# Patient Record
Sex: Female | Born: 1935 | Race: Black or African American | Hispanic: No | Smoking: Never smoker
Health system: Southern US, Community
[De-identification: ages and names within clinical notes are randomized; demographics above are authoritative.]

## PROBLEM LIST (undated history)

## (undated) DIAGNOSIS — E785 Hyperlipidemia, unspecified: Secondary | ICD-10-CM

## (undated) DIAGNOSIS — R0600 Dyspnea, unspecified: Secondary | ICD-10-CM

## (undated) DIAGNOSIS — I10 Essential (primary) hypertension: Secondary | ICD-10-CM

## (undated) HISTORY — DX: Morbid (severe) obesity due to excess calories: E66.01

## (undated) HISTORY — DX: Dyspnea, unspecified: R06.00

## (undated) HISTORY — DX: Hyperlipidemia, unspecified: E78.5

## (undated) HISTORY — DX: Essential (primary) hypertension: I10

---

## 1970-04-05 HISTORY — PX: PARTIAL HYSTERECTOMY: SHX80

## 2006-11-11 ENCOUNTER — Encounter: Admission: RE | Admit: 2006-11-11 | Discharge: 2006-11-11 | Payer: Self-pay | Admitting: Internal Medicine

## 2007-11-15 ENCOUNTER — Encounter: Admission: RE | Admit: 2007-11-15 | Discharge: 2007-11-15 | Payer: Self-pay | Admitting: Internal Medicine

## 2008-07-12 HISTORY — PX: TRANSTHORACIC ECHOCARDIOGRAM: SHX275

## 2008-07-12 HISTORY — PX: CARDIOVASCULAR STRESS TEST: SHX262

## 2008-08-02 ENCOUNTER — Encounter: Admission: RE | Admit: 2008-08-02 | Discharge: 2008-08-02 | Payer: Self-pay | Admitting: Cardiology

## 2008-08-07 ENCOUNTER — Ambulatory Visit (HOSPITAL_COMMUNITY): Admission: RE | Admit: 2008-08-07 | Discharge: 2008-08-07 | Payer: Self-pay | Admitting: Cardiology

## 2008-08-07 HISTORY — PX: CARDIAC CATHETERIZATION: SHX172

## 2008-11-25 ENCOUNTER — Encounter: Admission: RE | Admit: 2008-11-25 | Discharge: 2008-11-25 | Payer: Self-pay | Admitting: Internal Medicine

## 2009-08-04 ENCOUNTER — Encounter: Admission: RE | Admit: 2009-08-04 | Discharge: 2009-08-04 | Payer: Self-pay | Admitting: Internal Medicine

## 2010-02-01 ENCOUNTER — Emergency Department (HOSPITAL_COMMUNITY): Admission: EM | Admit: 2010-02-01 | Discharge: 2010-02-01 | Payer: Self-pay | Admitting: Emergency Medicine

## 2010-07-14 LAB — POCT I-STAT 3, ART BLOOD GAS (G3+)
Bicarbonate: 22.9 mEq/L (ref 20.0–24.0)
TCO2: 24 mmol/L (ref 0–100)
pH, Arterial: 7.439 — ABNORMAL HIGH (ref 7.350–7.400)
pO2, Arterial: 67 mmHg — ABNORMAL LOW (ref 80.0–100.0)

## 2010-07-14 LAB — POCT I-STAT 3, VENOUS BLOOD GAS (G3P V)
Acid-base deficit: 3 mmol/L — ABNORMAL HIGH (ref 0.0–2.0)
Bicarbonate: 21.8 mEq/L (ref 20.0–24.0)
O2 Saturation: 69 %

## 2010-08-18 NOTE — Cardiovascular Report (Signed)
Brianna David, Brianna David            ACCOUNT NO.:  [id number]   MEDICAL RECORD NO.:  [id number]          PATIENT TYPE:  OIB   LOCATION:  2899                         FACILITY:  [facility]   PHYSICIAN:  Sheliah Mends, MD      DATE OF BIRTH:  1929/12/26   DATE OF PROCEDURE:  08/07/2008  DATE OF DISCHARGE:  08/07/2008                            CARDIAC CATHETERIZATION   INDICATION:  This is a 77 year old lady with complaints of chest pain,  shortness of breath and dyspnea on exertion, as well as a markedly  abnormal, high-risk myocardial perfusion scan, who was referred for  cardiac catheterization.  Given the patient's symptoms, risk factors,  and abnormal ancillary studies, decision was made to proceed with  cardiac catheterization.   PROCEDURE:  After informed consent was obtained, the patient was brought  to the second floor cardiac catheterization lab at [facility].  She was draped in sterile fashion.  Local anesthesia using 1% Xylocaine  was applied to the right groin.  Venous access was obtained using a 6-  French venous sheath.  Access to the femoral vein using a modified  Seldinger technique was obtained without complications.  Subsequently,  access to the right femoral artery was obtained using a 5-French  arterial sheath.  Subsequently, a right heart catheterization was  performed using a Swan-Ganz catheter.  Left heart catheterization was  completed using a Judkins 5-French #4 catheter for the left coronary  system.  Due to a unusual anterior takeoff of the right coronary artery,  selective right coronary angiography using a Judkins catheter, as well  as a no-torque catheter, was unsuccessful.  Subsequently, the right  coronary artery was localized using a AL-1 catheter.   FINDINGS:  Selective coronary angiography:  Left main coronary artery:  The left main coronary artery is a medium-sized vessel.  It trifurcates  in the LAD, ramus intermedius, and left circumflex  artery.  The left  main coronary artery has mild 10% lesion in the distal segment but no  angiographically significant disease.   Left anterior descending artery:  The left anterior descending artery is  a long, medium-caliber vessel.  It gives off a small diagonal branch.  There is no significant disease seen.   Ramus intermedius:  The ramus intermedius is a medium-sized vessel.  There is no angiographically significant disease noted.   Left circumflex artery:  The left circumflex artery is a large, dominant  vessel.  There is no hemodynamically significant disease seen in the  left circumflex artery.  The left circumflex artery gives off a large,  branching obtuse marginal 1 branch, a smaller obtuse marginal 2 branch,  and a PLA.  There is no hemodynamically significant disease seen.   Right coronary artery:  The right coronary artery is a small,  nondominant vessel with an anterior takeoff.  There is no significant  disease seen in the RCA.   HEMODYNAMIC RESULTS:  Aortic pressure 144/78 mmHg.  Left ventricular  pressure 140/17 mmHg.  There is no significant aortic gradient noted on  pullback.   The left ventriculogram shows normal left ventricular function, small  left  ventricle, and an ejection fraction of 70%.   Right heart catheterization:  RA pressure mean of 17, RV pressure 44/17  mmHg, PA pressure 42/19 mmHg, PCWP mean 26.   SUMMARY:  1. Normal coronary arteries without hemodynamic significant lesion.  2. Normal left ventricular function.  3. Mildly elevated right heart pressures, likely due to morbid      obesity.   PLAN:  The patient will be continued on aggressive risk factor  management, and a sleep study to evaluate for obstructive sleep apnea  may be indicated.   The procedure was completed without complication.      Sheliah Mends, MD  Electronically Signed     JE/MEDQ  D:  08/07/2008  T:  08/08/2008  Job:  [id number]

## 2013-02-09 ENCOUNTER — Ambulatory Visit: Payer: Self-pay | Admitting: Internal Medicine

## 2013-02-19 ENCOUNTER — Encounter: Payer: Self-pay | Admitting: Internal Medicine

## 2013-02-20 ENCOUNTER — Encounter: Payer: Self-pay | Admitting: Internal Medicine

## 2013-02-20 ENCOUNTER — Ambulatory Visit (INDEPENDENT_AMBULATORY_CARE_PROVIDER_SITE_OTHER): Payer: Medicare PPO | Admitting: Internal Medicine

## 2013-02-20 VITALS — BP 146/82 | HR 83 | Ht 67.0 in | Wt 239.1 lb

## 2013-02-20 DIAGNOSIS — R0609 Other forms of dyspnea: Secondary | ICD-10-CM

## 2013-02-20 DIAGNOSIS — Z23 Encounter for immunization: Secondary | ICD-10-CM

## 2013-02-20 DIAGNOSIS — I10 Essential (primary) hypertension: Secondary | ICD-10-CM

## 2013-02-20 NOTE — Patient Instructions (Signed)
Your physician wants you to follow-up in: 1 year. You will receive a reminder letter in the mail two months in advance. If you don't receive a letter, please call our office to schedule the follow-up appointment.  

## 2013-02-20 NOTE — Progress Notes (Signed)
OFFICE NOTE  Chief Complaint:  Routine follow-up, DOE  Primary Care Physician: Alva Garnet., MD  HPI:  Brianna David is a pleasant 79 year old female with a history of hypertension, morbid obesity, and chronic dyspnea. Just as mild dyslipidemia but is not on a statin. I last saw her over year ago and she been having some problems with nausea and vomiting and had had blood work which revealed a marked leukocytosis. This apparently resolved as did her symptoms. Since then she's been doing fairly well. She has a significant sedentary lifestyle and reports not being able to exercise due to bilateral knee pain. For some reason she's managed to lose about 20 pounds, mostly due to decreasing her food intake. She was unaware that she could do upper extremity exercises even though her knees bother her. She currently denies any chest pain.  PMHx:  Past Medical History  Diagnosis Date  . Hypertension   . Morbid obesity   . Hyperlipidemia   . Dyspnea     Past Surgical History  Procedure Laterality Date  . Transthoracic echocardiogram  07/12/2008    EF 73%, mild AI, trace MR, PR, and TR  . Cardiovascular stress test  07/12/2008    Evidence of moderate ischemia seen in Basal Anterior, Mid Anterior, Apical Anterior, and Apical regions. EKG is negative for ischemia.  . Cardiac catheterization  08/07/2008    Normal coronary arteries and LV function, mildly elevated R heart pressures-due to morbid obesity, continued on aggresive risk factor management  . Partial hysterectomy  1972    FAMHx:  Family History  Problem Relation Age of Onset  . Cancer Mother   . Heart attack Father   . Heart failure Maternal Grandmother   . Heart failure Maternal Grandfather     SOCHx:   reports that she has quit smoking. She has never used smokeless tobacco. She reports that she does not drink alcohol or use illicit drugs.  ALLERGIES:  Allergies  Allergen Reactions  . Penicillins     ROS: A  comprehensive review of systems was negative except for: Constitutional: positive for fatigue and weight loss Respiratory: positive for dyspnea on exertion  HOME MEDS: Current Outpatient Prescriptions  Medication Sig Dispense Refill  . aspirin 325 MG tablet Take 325 mg by mouth daily.      . Cholecalciferol 1000 UNITS capsule Take 1,000 Units by mouth daily.      Marland Kitchen diltiazem (DILACOR XR) 240 MG 24 hr capsule Take 240 mg by mouth daily.      Marland Kitchen losartan-hydrochlorothiazide (HYZAAR) 100-25 MG per tablet Take 1 tablet by mouth daily.      . meloxicam (MOBIC) 15 MG tablet Take 1 tablet by mouth daily as needed.      . potassium chloride (MICRO-K) 10 MEQ CR capsule Take 1 capsule by mouth daily.      . traMADol-acetaminophen (ULTRACET) 37.5-325 MG per tablet Take 1-2 tablets by mouth every 6 (six) hours as needed.       No current facility-administered medications for this visit.    LABS/IMAGING: No results found for this or any previous visit (from the past 48 hour(s)). No results found.  VITALS: BP 146/82  Pulse 83  Ht 5\' 7"  (1.702 m)  Wt 239 lb 1.6 oz (108.455 kg)  BMI 37.44 kg/m2  EXAM: General appearance: alert and no distress Neck: no carotid bruit and no JVD Lungs: clear to auscultation bilaterally Heart: regular rate and rhythm, S1, S2 normal, no murmur, click,  rub or gallop Abdomen: soft, non-tender; bowel sounds normal; no masses,  no organomegaly and morbidly obese Extremities: edema trace bilateral LE edema Pulses: 2+ and symmetric Skin: Skin color, texture, turgor normal. No rashes or lesions Neurologic: Grossly normal Psych: Mood, affect normal  EKG: Normal sinus rhythm at 83  ASSESSMENT: 1. Morbid obesity, with mild recent weight loss 2. Hypertension-fairly well-controlled 3. Dyslipidemia-recently assessed by primary care provider 4. Chronic bilateral knee pain 5. Sedentary lifestyle  PLAN: 1.   Brianna David has managed to lose some weight by decreasing her  food intake, but would benefit from exercise. I recommended that she consider doing some upper remedy exercises as she complains of bilateral knee pain. Her blood pressure is fairly well-controlled on her current medicines and we can followup with her annually or sooner as necessary. We will go head and request labs from Dr. Mathews Robinsons office to review her cholesterol control.   finally, she requested and we administered a flu shot today in the office.  Chrystie Nose, MD, [facility] Attending Cardiologist [facility]  HILTY,Kenneth C 02/20/2013, 2:22 PM

## 2014-03-06 ENCOUNTER — Encounter: Payer: Self-pay | Admitting: Internal Medicine

## 2014-03-06 ENCOUNTER — Ambulatory Visit (INDEPENDENT_AMBULATORY_CARE_PROVIDER_SITE_OTHER): Payer: Medicare PPO | Admitting: Internal Medicine

## 2014-03-06 ENCOUNTER — Ambulatory Visit (INDEPENDENT_AMBULATORY_CARE_PROVIDER_SITE_OTHER): Payer: Medicare PPO | Admitting: *Deleted

## 2014-03-06 DIAGNOSIS — Z23 Encounter for immunization: Secondary | ICD-10-CM

## 2014-03-06 DIAGNOSIS — I10 Essential (primary) hypertension: Secondary | ICD-10-CM

## 2014-03-06 DIAGNOSIS — R0609 Other forms of dyspnea: Secondary | ICD-10-CM

## 2014-03-06 NOTE — Patient Instructions (Signed)
Your physician wants you to follow-up in: 1 Year. You will receive a reminder letter in the mail two months in advance. If you don't receive a letter, please call our office to schedule the follow-up appointment.  

## 2014-03-06 NOTE — Progress Notes (Signed)
OFFICE NOTE  Chief Complaint:  Routine follow-up, DOE  Primary Care Physician: Alva GarnetSHELTON,KIMBERLY R., MD  HPI:  Brianna David is a pleasant 80 year old female with a history of hypertension, morbid obesity, and chronic dyspnea. Just as mild dyslipidemia but is not on a statin. I last saw her over year ago and she been having some problems with nausea and vomiting and had had blood work which revealed a marked leukocytosis. This apparently resolved as did her symptoms. Since then she's been doing fairly well. She has a significant sedentary lifestyle and reports not being able to exercise due to bilateral knee pain. For some reason she's managed to lose about 20 pounds, mostly due to decreasing her food intake. She was unaware that she could do upper extremity exercises even though her knees bother her. She currently denies any chest pain.  Brianna David returns today and is doing fairly well. Her blood pressure is a little elevated however she reports being in pain. She is out of her pain medicine and has significant arthritis in her right knee. She recently had an injection in the knee and her hip. She is going to transition her care with Brianna David to another provider in the practice. She is interested in a flu vaccine today.  PMHx:  Past Medical History  Diagnosis Date  . Hypertension   . Morbid obesity   . Hyperlipidemia   . Dyspnea     Past Surgical History  Procedure Laterality Date  . Transthoracic echocardiogram  07/12/2008    EF 73%, mild AI, trace MR, PR, and TR  . Cardiovascular stress test  07/12/2008    Evidence of moderate ischemia seen in Basal Anterior, Mid Anterior, Apical Anterior, and Apical regions. EKG is negative for ischemia.  . Cardiac catheterization  08/07/2008    Normal coronary arteries and LV function, mildly elevated R heart pressures-due to morbid obesity, continued on aggresive risk factor management  . Partial hysterectomy  1972    FAMHx:  Family  History  Problem Relation Age of Onset  . Cancer Mother   . Heart attack Father   . Heart failure Maternal Grandmother   . Heart failure Maternal Grandfather     SOCHx:   reports that she has quit smoking. She has never used smokeless tobacco. She reports that she does not drink alcohol or use illicit drugs.  ALLERGIES:  Allergies  Allergen Reactions  . Penicillins     ROS: A comprehensive review of systems was negative except for: Respiratory: positive for dyspnea on exertion Musculoskeletal: positive for arthralgias  HOME MEDS: Current Outpatient Prescriptions  Medication Sig Dispense Refill  . aspirin 325 MG tablet Take 325 mg by mouth daily.    . Cholecalciferol 1000 UNITS capsule Take 1,000 Units by mouth daily.    Marland Kitchen. diltiazem (TIAZAC) 240 MG 24 hr capsule     . potassium chloride (MICRO-K) 10 MEQ CR capsule Take 1 capsule by mouth daily.    . traMADol-acetaminophen (ULTRACET) 37.5-325 MG per tablet Take 1-2 tablets by mouth every 6 (six) hours as needed.     No current facility-administered medications for this visit.    LABS/IMAGING: No results found for this or any previous visit (from the past 48 hour(s)). No results found.  VITALS: BP 150/88 mmHg  Pulse 85  Ht 5\' 8"  (1.727 m)  Wt 235 lb 1.6 oz (106.641 kg)  BMI 35.76 kg/m2  EXAM: General appearance: alert and no distress Neck: no carotid bruit and  no JVD Lungs: clear to auscultation bilaterally Heart: regular rate and rhythm, S1, S2 normal, no murmur, click, rub or gallop Abdomen: soft, non-tender; bowel sounds normal; no masses,  no organomegaly and morbidly obese Extremities: edema trace bilateral LE edema Pulses: 2+ and symmetric Skin: Skin color, texture, turgor normal. No rashes or lesions Neurologic: Grossly normal Psych: Mood, affect normal  EKG: Normal sinus rhythm at 85  ASSESSMENT: 1. Morbid obesity, with mild recent weight loss 2. Hypertension-fairly  well-controlled 3. Dyslipidemia-recently assessed by primary care provider 4. Chronic bilateral knee pain 5. Sedentary lifestyle  PLAN: 1.   Brianna David has managed to lose some weight by decreasing her food intake, but would benefit from exercise. I recommended that she consider doing some upper remedy exercises as she complains of bilateral knee pain. Her blood pressure is fairly well-controlled on her current medicines and we can followup with her annually or sooner as necessary. We will go head and request labs from Dr. Mathews RobinsonsShelton's office to review her cholesterol control.   finally, she requested and we administered a flu shot today in the office.  Chrystie NoseKenneth C. Hilty, MD, [facility] Attending Cardiologist [facility]  HILTY,Kenneth C 03/06/2014, 4:42 PM

## 2014-05-01 DIAGNOSIS — Z5181 Encounter for therapeutic drug level monitoring: Secondary | ICD-10-CM | POA: Diagnosis not present

## 2014-05-01 DIAGNOSIS — M150 Primary generalized (osteo)arthritis: Secondary | ICD-10-CM | POA: Diagnosis not present

## 2014-05-01 DIAGNOSIS — M545 Low back pain: Secondary | ICD-10-CM | POA: Diagnosis not present

## 2014-05-01 DIAGNOSIS — R7309 Other abnormal glucose: Secondary | ICD-10-CM | POA: Diagnosis not present

## 2014-05-01 DIAGNOSIS — E559 Vitamin D deficiency, unspecified: Secondary | ICD-10-CM | POA: Diagnosis not present

## 2014-05-01 DIAGNOSIS — Z79899 Other long term (current) drug therapy: Secondary | ICD-10-CM | POA: Diagnosis not present

## 2014-05-01 DIAGNOSIS — I10 Essential (primary) hypertension: Secondary | ICD-10-CM | POA: Diagnosis not present

## 2014-05-01 DIAGNOSIS — R269 Unspecified abnormalities of gait and mobility: Secondary | ICD-10-CM | POA: Diagnosis not present

## 2014-05-01 DIAGNOSIS — E668 Other obesity: Secondary | ICD-10-CM | POA: Diagnosis not present

## 2014-07-10 DIAGNOSIS — E668 Other obesity: Secondary | ICD-10-CM | POA: Diagnosis not present

## 2014-07-10 DIAGNOSIS — M150 Primary generalized (osteo)arthritis: Secondary | ICD-10-CM | POA: Diagnosis not present

## 2014-07-10 DIAGNOSIS — Z79899 Other long term (current) drug therapy: Secondary | ICD-10-CM | POA: Diagnosis not present

## 2014-07-10 DIAGNOSIS — I10 Essential (primary) hypertension: Secondary | ICD-10-CM | POA: Diagnosis not present

## 2014-07-17 DIAGNOSIS — Z9181 History of falling: Secondary | ICD-10-CM | POA: Diagnosis not present

## 2014-07-17 DIAGNOSIS — R269 Unspecified abnormalities of gait and mobility: Secondary | ICD-10-CM | POA: Diagnosis not present

## 2014-07-17 DIAGNOSIS — E785 Hyperlipidemia, unspecified: Secondary | ICD-10-CM | POA: Diagnosis not present

## 2014-07-17 DIAGNOSIS — M159 Polyosteoarthritis, unspecified: Secondary | ICD-10-CM | POA: Diagnosis not present

## 2014-07-17 DIAGNOSIS — I10 Essential (primary) hypertension: Secondary | ICD-10-CM | POA: Diagnosis not present

## 2014-07-17 DIAGNOSIS — I2510 Atherosclerotic heart disease of native coronary artery without angina pectoris: Secondary | ICD-10-CM | POA: Diagnosis not present

## 2014-07-17 DIAGNOSIS — E669 Obesity, unspecified: Secondary | ICD-10-CM | POA: Diagnosis not present

## 2014-07-18 DIAGNOSIS — Z9181 History of falling: Secondary | ICD-10-CM | POA: Diagnosis not present

## 2014-07-18 DIAGNOSIS — I2510 Atherosclerotic heart disease of native coronary artery without angina pectoris: Secondary | ICD-10-CM | POA: Diagnosis not present

## 2014-07-18 DIAGNOSIS — E785 Hyperlipidemia, unspecified: Secondary | ICD-10-CM | POA: Diagnosis not present

## 2014-07-18 DIAGNOSIS — R269 Unspecified abnormalities of gait and mobility: Secondary | ICD-10-CM | POA: Diagnosis not present

## 2014-07-18 DIAGNOSIS — E669 Obesity, unspecified: Secondary | ICD-10-CM | POA: Diagnosis not present

## 2014-07-18 DIAGNOSIS — I10 Essential (primary) hypertension: Secondary | ICD-10-CM | POA: Diagnosis not present

## 2014-07-18 DIAGNOSIS — M159 Polyosteoarthritis, unspecified: Secondary | ICD-10-CM | POA: Diagnosis not present

## 2014-07-24 DIAGNOSIS — I10 Essential (primary) hypertension: Secondary | ICD-10-CM | POA: Diagnosis not present

## 2014-07-24 DIAGNOSIS — E785 Hyperlipidemia, unspecified: Secondary | ICD-10-CM | POA: Diagnosis not present

## 2014-07-24 DIAGNOSIS — M159 Polyosteoarthritis, unspecified: Secondary | ICD-10-CM | POA: Diagnosis not present

## 2014-07-24 DIAGNOSIS — I2510 Atherosclerotic heart disease of native coronary artery without angina pectoris: Secondary | ICD-10-CM | POA: Diagnosis not present

## 2014-07-24 DIAGNOSIS — R269 Unspecified abnormalities of gait and mobility: Secondary | ICD-10-CM | POA: Diagnosis not present

## 2014-07-24 DIAGNOSIS — Z9181 History of falling: Secondary | ICD-10-CM | POA: Diagnosis not present

## 2014-07-24 DIAGNOSIS — E669 Obesity, unspecified: Secondary | ICD-10-CM | POA: Diagnosis not present

## 2014-07-25 DIAGNOSIS — M159 Polyosteoarthritis, unspecified: Secondary | ICD-10-CM | POA: Diagnosis not present

## 2014-07-25 DIAGNOSIS — I10 Essential (primary) hypertension: Secondary | ICD-10-CM | POA: Diagnosis not present

## 2014-07-25 DIAGNOSIS — R269 Unspecified abnormalities of gait and mobility: Secondary | ICD-10-CM | POA: Diagnosis not present

## 2014-07-25 DIAGNOSIS — Z9181 History of falling: Secondary | ICD-10-CM | POA: Diagnosis not present

## 2014-07-25 DIAGNOSIS — E785 Hyperlipidemia, unspecified: Secondary | ICD-10-CM | POA: Diagnosis not present

## 2014-07-25 DIAGNOSIS — E669 Obesity, unspecified: Secondary | ICD-10-CM | POA: Diagnosis not present

## 2014-07-25 DIAGNOSIS — I2510 Atherosclerotic heart disease of native coronary artery without angina pectoris: Secondary | ICD-10-CM | POA: Diagnosis not present

## 2014-07-26 DIAGNOSIS — R269 Unspecified abnormalities of gait and mobility: Secondary | ICD-10-CM | POA: Diagnosis not present

## 2014-07-26 DIAGNOSIS — Z9181 History of falling: Secondary | ICD-10-CM | POA: Diagnosis not present

## 2014-07-26 DIAGNOSIS — M159 Polyosteoarthritis, unspecified: Secondary | ICD-10-CM | POA: Diagnosis not present

## 2014-07-26 DIAGNOSIS — E669 Obesity, unspecified: Secondary | ICD-10-CM | POA: Diagnosis not present

## 2014-07-26 DIAGNOSIS — E785 Hyperlipidemia, unspecified: Secondary | ICD-10-CM | POA: Diagnosis not present

## 2014-07-26 DIAGNOSIS — I2510 Atherosclerotic heart disease of native coronary artery without angina pectoris: Secondary | ICD-10-CM | POA: Diagnosis not present

## 2014-07-26 DIAGNOSIS — I10 Essential (primary) hypertension: Secondary | ICD-10-CM | POA: Diagnosis not present

## 2014-07-30 DIAGNOSIS — Z0000 Encounter for general adult medical examination without abnormal findings: Secondary | ICD-10-CM | POA: Diagnosis not present

## 2014-07-30 DIAGNOSIS — R296 Repeated falls: Secondary | ICD-10-CM | POA: Diagnosis not present

## 2014-07-30 DIAGNOSIS — M150 Primary generalized (osteo)arthritis: Secondary | ICD-10-CM | POA: Diagnosis not present

## 2014-07-30 DIAGNOSIS — R2681 Unsteadiness on feet: Secondary | ICD-10-CM | POA: Diagnosis not present

## 2014-07-30 DIAGNOSIS — I10 Essential (primary) hypertension: Secondary | ICD-10-CM | POA: Diagnosis not present

## 2014-07-31 DIAGNOSIS — M159 Polyosteoarthritis, unspecified: Secondary | ICD-10-CM | POA: Diagnosis not present

## 2014-07-31 DIAGNOSIS — I2510 Atherosclerotic heart disease of native coronary artery without angina pectoris: Secondary | ICD-10-CM | POA: Diagnosis not present

## 2014-07-31 DIAGNOSIS — E669 Obesity, unspecified: Secondary | ICD-10-CM | POA: Diagnosis not present

## 2014-07-31 DIAGNOSIS — E785 Hyperlipidemia, unspecified: Secondary | ICD-10-CM | POA: Diagnosis not present

## 2014-07-31 DIAGNOSIS — Z9181 History of falling: Secondary | ICD-10-CM | POA: Diagnosis not present

## 2014-07-31 DIAGNOSIS — R269 Unspecified abnormalities of gait and mobility: Secondary | ICD-10-CM | POA: Diagnosis not present

## 2014-07-31 DIAGNOSIS — I10 Essential (primary) hypertension: Secondary | ICD-10-CM | POA: Diagnosis not present

## 2014-08-01 DIAGNOSIS — R269 Unspecified abnormalities of gait and mobility: Secondary | ICD-10-CM | POA: Diagnosis not present

## 2014-08-01 DIAGNOSIS — E785 Hyperlipidemia, unspecified: Secondary | ICD-10-CM | POA: Diagnosis not present

## 2014-08-01 DIAGNOSIS — E669 Obesity, unspecified: Secondary | ICD-10-CM | POA: Diagnosis not present

## 2014-08-01 DIAGNOSIS — M159 Polyosteoarthritis, unspecified: Secondary | ICD-10-CM | POA: Diagnosis not present

## 2014-08-01 DIAGNOSIS — I2510 Atherosclerotic heart disease of native coronary artery without angina pectoris: Secondary | ICD-10-CM | POA: Diagnosis not present

## 2014-08-01 DIAGNOSIS — Z9181 History of falling: Secondary | ICD-10-CM | POA: Diagnosis not present

## 2014-08-01 DIAGNOSIS — I10 Essential (primary) hypertension: Secondary | ICD-10-CM | POA: Diagnosis not present

## 2014-08-02 DIAGNOSIS — I10 Essential (primary) hypertension: Secondary | ICD-10-CM | POA: Diagnosis not present

## 2014-08-02 DIAGNOSIS — E669 Obesity, unspecified: Secondary | ICD-10-CM | POA: Diagnosis not present

## 2014-08-02 DIAGNOSIS — E785 Hyperlipidemia, unspecified: Secondary | ICD-10-CM | POA: Diagnosis not present

## 2014-08-02 DIAGNOSIS — R269 Unspecified abnormalities of gait and mobility: Secondary | ICD-10-CM | POA: Diagnosis not present

## 2014-08-02 DIAGNOSIS — M159 Polyosteoarthritis, unspecified: Secondary | ICD-10-CM | POA: Diagnosis not present

## 2014-08-02 DIAGNOSIS — Z9181 History of falling: Secondary | ICD-10-CM | POA: Diagnosis not present

## 2014-08-02 DIAGNOSIS — I2510 Atherosclerotic heart disease of native coronary artery without angina pectoris: Secondary | ICD-10-CM | POA: Diagnosis not present

## 2014-08-05 DIAGNOSIS — E669 Obesity, unspecified: Secondary | ICD-10-CM | POA: Diagnosis not present

## 2014-08-05 DIAGNOSIS — I10 Essential (primary) hypertension: Secondary | ICD-10-CM | POA: Diagnosis not present

## 2014-08-05 DIAGNOSIS — M159 Polyosteoarthritis, unspecified: Secondary | ICD-10-CM | POA: Diagnosis not present

## 2014-08-05 DIAGNOSIS — Z9181 History of falling: Secondary | ICD-10-CM | POA: Diagnosis not present

## 2014-08-05 DIAGNOSIS — R269 Unspecified abnormalities of gait and mobility: Secondary | ICD-10-CM | POA: Diagnosis not present

## 2014-08-05 DIAGNOSIS — I2510 Atherosclerotic heart disease of native coronary artery without angina pectoris: Secondary | ICD-10-CM | POA: Diagnosis not present

## 2014-08-05 DIAGNOSIS — E785 Hyperlipidemia, unspecified: Secondary | ICD-10-CM | POA: Diagnosis not present

## 2014-08-07 DIAGNOSIS — I2510 Atherosclerotic heart disease of native coronary artery without angina pectoris: Secondary | ICD-10-CM | POA: Diagnosis not present

## 2014-08-07 DIAGNOSIS — M159 Polyosteoarthritis, unspecified: Secondary | ICD-10-CM | POA: Diagnosis not present

## 2014-08-07 DIAGNOSIS — I10 Essential (primary) hypertension: Secondary | ICD-10-CM | POA: Diagnosis not present

## 2014-08-07 DIAGNOSIS — E785 Hyperlipidemia, unspecified: Secondary | ICD-10-CM | POA: Diagnosis not present

## 2014-08-07 DIAGNOSIS — E669 Obesity, unspecified: Secondary | ICD-10-CM | POA: Diagnosis not present

## 2014-08-07 DIAGNOSIS — Z9181 History of falling: Secondary | ICD-10-CM | POA: Diagnosis not present

## 2014-08-07 DIAGNOSIS — R269 Unspecified abnormalities of gait and mobility: Secondary | ICD-10-CM | POA: Diagnosis not present

## 2014-08-12 DIAGNOSIS — E785 Hyperlipidemia, unspecified: Secondary | ICD-10-CM | POA: Diagnosis not present

## 2014-08-12 DIAGNOSIS — Z9181 History of falling: Secondary | ICD-10-CM | POA: Diagnosis not present

## 2014-08-12 DIAGNOSIS — E669 Obesity, unspecified: Secondary | ICD-10-CM | POA: Diagnosis not present

## 2014-08-12 DIAGNOSIS — R269 Unspecified abnormalities of gait and mobility: Secondary | ICD-10-CM | POA: Diagnosis not present

## 2014-08-12 DIAGNOSIS — I10 Essential (primary) hypertension: Secondary | ICD-10-CM | POA: Diagnosis not present

## 2014-08-12 DIAGNOSIS — I2510 Atherosclerotic heart disease of native coronary artery without angina pectoris: Secondary | ICD-10-CM | POA: Diagnosis not present

## 2014-08-12 DIAGNOSIS — M159 Polyosteoarthritis, unspecified: Secondary | ICD-10-CM | POA: Diagnosis not present

## 2014-08-14 DIAGNOSIS — I10 Essential (primary) hypertension: Secondary | ICD-10-CM | POA: Diagnosis not present

## 2014-08-14 DIAGNOSIS — M159 Polyosteoarthritis, unspecified: Secondary | ICD-10-CM | POA: Diagnosis not present

## 2014-08-14 DIAGNOSIS — R269 Unspecified abnormalities of gait and mobility: Secondary | ICD-10-CM | POA: Diagnosis not present

## 2014-08-14 DIAGNOSIS — M2550 Pain in unspecified joint: Secondary | ICD-10-CM | POA: Diagnosis not present

## 2014-08-14 DIAGNOSIS — E669 Obesity, unspecified: Secondary | ICD-10-CM | POA: Diagnosis not present

## 2014-08-14 DIAGNOSIS — M150 Primary generalized (osteo)arthritis: Secondary | ICD-10-CM | POA: Diagnosis not present

## 2014-08-14 DIAGNOSIS — I2510 Atherosclerotic heart disease of native coronary artery without angina pectoris: Secondary | ICD-10-CM | POA: Diagnosis not present

## 2014-08-14 DIAGNOSIS — M25512 Pain in left shoulder: Secondary | ICD-10-CM | POA: Diagnosis not present

## 2014-08-14 DIAGNOSIS — Z9181 History of falling: Secondary | ICD-10-CM | POA: Diagnosis not present

## 2014-08-14 DIAGNOSIS — E785 Hyperlipidemia, unspecified: Secondary | ICD-10-CM | POA: Diagnosis not present

## 2014-08-15 DIAGNOSIS — I10 Essential (primary) hypertension: Secondary | ICD-10-CM | POA: Diagnosis not present

## 2014-08-15 DIAGNOSIS — E669 Obesity, unspecified: Secondary | ICD-10-CM | POA: Diagnosis not present

## 2014-08-15 DIAGNOSIS — Z9181 History of falling: Secondary | ICD-10-CM | POA: Diagnosis not present

## 2014-08-15 DIAGNOSIS — R269 Unspecified abnormalities of gait and mobility: Secondary | ICD-10-CM | POA: Diagnosis not present

## 2014-08-15 DIAGNOSIS — I2510 Atherosclerotic heart disease of native coronary artery without angina pectoris: Secondary | ICD-10-CM | POA: Diagnosis not present

## 2014-08-15 DIAGNOSIS — M159 Polyosteoarthritis, unspecified: Secondary | ICD-10-CM | POA: Diagnosis not present

## 2014-08-15 DIAGNOSIS — E785 Hyperlipidemia, unspecified: Secondary | ICD-10-CM | POA: Diagnosis not present

## 2014-08-17 DIAGNOSIS — Z9181 History of falling: Secondary | ICD-10-CM | POA: Diagnosis not present

## 2014-08-17 DIAGNOSIS — M159 Polyosteoarthritis, unspecified: Secondary | ICD-10-CM | POA: Diagnosis not present

## 2014-08-17 DIAGNOSIS — E785 Hyperlipidemia, unspecified: Secondary | ICD-10-CM | POA: Diagnosis not present

## 2014-08-17 DIAGNOSIS — E669 Obesity, unspecified: Secondary | ICD-10-CM | POA: Diagnosis not present

## 2014-08-17 DIAGNOSIS — I2510 Atherosclerotic heart disease of native coronary artery without angina pectoris: Secondary | ICD-10-CM | POA: Diagnosis not present

## 2014-08-17 DIAGNOSIS — I10 Essential (primary) hypertension: Secondary | ICD-10-CM | POA: Diagnosis not present

## 2014-08-17 DIAGNOSIS — R269 Unspecified abnormalities of gait and mobility: Secondary | ICD-10-CM | POA: Diagnosis not present

## 2014-08-20 DIAGNOSIS — Z9181 History of falling: Secondary | ICD-10-CM | POA: Diagnosis not present

## 2014-08-20 DIAGNOSIS — R269 Unspecified abnormalities of gait and mobility: Secondary | ICD-10-CM | POA: Diagnosis not present

## 2014-08-20 DIAGNOSIS — E669 Obesity, unspecified: Secondary | ICD-10-CM | POA: Diagnosis not present

## 2014-08-20 DIAGNOSIS — I2510 Atherosclerotic heart disease of native coronary artery without angina pectoris: Secondary | ICD-10-CM | POA: Diagnosis not present

## 2014-08-20 DIAGNOSIS — E785 Hyperlipidemia, unspecified: Secondary | ICD-10-CM | POA: Diagnosis not present

## 2014-08-20 DIAGNOSIS — M159 Polyosteoarthritis, unspecified: Secondary | ICD-10-CM | POA: Diagnosis not present

## 2014-08-20 DIAGNOSIS — I10 Essential (primary) hypertension: Secondary | ICD-10-CM | POA: Diagnosis not present

## 2014-08-22 DIAGNOSIS — E785 Hyperlipidemia, unspecified: Secondary | ICD-10-CM | POA: Diagnosis not present

## 2014-08-22 DIAGNOSIS — R269 Unspecified abnormalities of gait and mobility: Secondary | ICD-10-CM | POA: Diagnosis not present

## 2014-08-22 DIAGNOSIS — Z9181 History of falling: Secondary | ICD-10-CM | POA: Diagnosis not present

## 2014-08-22 DIAGNOSIS — I2510 Atherosclerotic heart disease of native coronary artery without angina pectoris: Secondary | ICD-10-CM | POA: Diagnosis not present

## 2014-08-22 DIAGNOSIS — E669 Obesity, unspecified: Secondary | ICD-10-CM | POA: Diagnosis not present

## 2014-08-22 DIAGNOSIS — I10 Essential (primary) hypertension: Secondary | ICD-10-CM | POA: Diagnosis not present

## 2014-08-22 DIAGNOSIS — M159 Polyosteoarthritis, unspecified: Secondary | ICD-10-CM | POA: Diagnosis not present

## 2014-08-23 DIAGNOSIS — M159 Polyosteoarthritis, unspecified: Secondary | ICD-10-CM | POA: Diagnosis not present

## 2014-08-23 DIAGNOSIS — Z9181 History of falling: Secondary | ICD-10-CM | POA: Diagnosis not present

## 2014-08-23 DIAGNOSIS — I10 Essential (primary) hypertension: Secondary | ICD-10-CM | POA: Diagnosis not present

## 2014-08-23 DIAGNOSIS — E669 Obesity, unspecified: Secondary | ICD-10-CM | POA: Diagnosis not present

## 2014-08-23 DIAGNOSIS — R269 Unspecified abnormalities of gait and mobility: Secondary | ICD-10-CM | POA: Diagnosis not present

## 2014-08-23 DIAGNOSIS — I2510 Atherosclerotic heart disease of native coronary artery without angina pectoris: Secondary | ICD-10-CM | POA: Diagnosis not present

## 2014-08-23 DIAGNOSIS — E785 Hyperlipidemia, unspecified: Secondary | ICD-10-CM | POA: Diagnosis not present

## 2014-08-26 DIAGNOSIS — E785 Hyperlipidemia, unspecified: Secondary | ICD-10-CM | POA: Diagnosis not present

## 2014-08-26 DIAGNOSIS — R269 Unspecified abnormalities of gait and mobility: Secondary | ICD-10-CM | POA: Diagnosis not present

## 2014-08-26 DIAGNOSIS — M159 Polyosteoarthritis, unspecified: Secondary | ICD-10-CM | POA: Diagnosis not present

## 2014-08-26 DIAGNOSIS — I10 Essential (primary) hypertension: Secondary | ICD-10-CM | POA: Diagnosis not present

## 2014-08-26 DIAGNOSIS — I2510 Atherosclerotic heart disease of native coronary artery without angina pectoris: Secondary | ICD-10-CM | POA: Diagnosis not present

## 2014-08-26 DIAGNOSIS — Z9181 History of falling: Secondary | ICD-10-CM | POA: Diagnosis not present

## 2014-08-26 DIAGNOSIS — E669 Obesity, unspecified: Secondary | ICD-10-CM | POA: Diagnosis not present

## 2014-08-28 DIAGNOSIS — M159 Polyosteoarthritis, unspecified: Secondary | ICD-10-CM | POA: Diagnosis not present

## 2014-08-28 DIAGNOSIS — Z9181 History of falling: Secondary | ICD-10-CM | POA: Diagnosis not present

## 2014-08-28 DIAGNOSIS — E785 Hyperlipidemia, unspecified: Secondary | ICD-10-CM | POA: Diagnosis not present

## 2014-08-28 DIAGNOSIS — I10 Essential (primary) hypertension: Secondary | ICD-10-CM | POA: Diagnosis not present

## 2014-08-28 DIAGNOSIS — R269 Unspecified abnormalities of gait and mobility: Secondary | ICD-10-CM | POA: Diagnosis not present

## 2014-08-28 DIAGNOSIS — I2510 Atherosclerotic heart disease of native coronary artery without angina pectoris: Secondary | ICD-10-CM | POA: Diagnosis not present

## 2014-08-28 DIAGNOSIS — E669 Obesity, unspecified: Secondary | ICD-10-CM | POA: Diagnosis not present

## 2014-08-30 DIAGNOSIS — Z9181 History of falling: Secondary | ICD-10-CM | POA: Diagnosis not present

## 2014-08-30 DIAGNOSIS — I2510 Atherosclerotic heart disease of native coronary artery without angina pectoris: Secondary | ICD-10-CM | POA: Diagnosis not present

## 2014-08-30 DIAGNOSIS — I10 Essential (primary) hypertension: Secondary | ICD-10-CM | POA: Diagnosis not present

## 2014-08-30 DIAGNOSIS — E669 Obesity, unspecified: Secondary | ICD-10-CM | POA: Diagnosis not present

## 2014-08-30 DIAGNOSIS — M159 Polyosteoarthritis, unspecified: Secondary | ICD-10-CM | POA: Diagnosis not present

## 2014-08-30 DIAGNOSIS — R269 Unspecified abnormalities of gait and mobility: Secondary | ICD-10-CM | POA: Diagnosis not present

## 2014-08-30 DIAGNOSIS — E785 Hyperlipidemia, unspecified: Secondary | ICD-10-CM | POA: Diagnosis not present

## 2014-09-02 ENCOUNTER — Other Ambulatory Visit: Payer: Self-pay | Admitting: Internal Medicine

## 2014-09-03 DIAGNOSIS — I2510 Atherosclerotic heart disease of native coronary artery without angina pectoris: Secondary | ICD-10-CM | POA: Diagnosis not present

## 2014-09-03 DIAGNOSIS — M159 Polyosteoarthritis, unspecified: Secondary | ICD-10-CM | POA: Diagnosis not present

## 2014-09-03 DIAGNOSIS — Z9181 History of falling: Secondary | ICD-10-CM | POA: Diagnosis not present

## 2014-09-03 DIAGNOSIS — R269 Unspecified abnormalities of gait and mobility: Secondary | ICD-10-CM | POA: Diagnosis not present

## 2014-09-03 DIAGNOSIS — I10 Essential (primary) hypertension: Secondary | ICD-10-CM | POA: Diagnosis not present

## 2014-09-03 DIAGNOSIS — E785 Hyperlipidemia, unspecified: Secondary | ICD-10-CM | POA: Diagnosis not present

## 2014-09-03 DIAGNOSIS — E669 Obesity, unspecified: Secondary | ICD-10-CM | POA: Diagnosis not present

## 2014-09-04 DIAGNOSIS — M159 Polyosteoarthritis, unspecified: Secondary | ICD-10-CM | POA: Diagnosis not present

## 2014-09-04 DIAGNOSIS — I2510 Atherosclerotic heart disease of native coronary artery without angina pectoris: Secondary | ICD-10-CM | POA: Diagnosis not present

## 2014-09-04 DIAGNOSIS — I10 Essential (primary) hypertension: Secondary | ICD-10-CM | POA: Diagnosis not present

## 2014-09-04 DIAGNOSIS — Z9181 History of falling: Secondary | ICD-10-CM | POA: Diagnosis not present

## 2014-09-04 DIAGNOSIS — E785 Hyperlipidemia, unspecified: Secondary | ICD-10-CM | POA: Diagnosis not present

## 2014-09-04 DIAGNOSIS — R269 Unspecified abnormalities of gait and mobility: Secondary | ICD-10-CM | POA: Diagnosis not present

## 2014-09-04 DIAGNOSIS — E669 Obesity, unspecified: Secondary | ICD-10-CM | POA: Diagnosis not present

## 2014-09-07 DIAGNOSIS — M159 Polyosteoarthritis, unspecified: Secondary | ICD-10-CM | POA: Diagnosis not present

## 2014-09-07 DIAGNOSIS — E785 Hyperlipidemia, unspecified: Secondary | ICD-10-CM | POA: Diagnosis not present

## 2014-09-07 DIAGNOSIS — R269 Unspecified abnormalities of gait and mobility: Secondary | ICD-10-CM | POA: Diagnosis not present

## 2014-09-07 DIAGNOSIS — Z9181 History of falling: Secondary | ICD-10-CM | POA: Diagnosis not present

## 2014-09-07 DIAGNOSIS — I10 Essential (primary) hypertension: Secondary | ICD-10-CM | POA: Diagnosis not present

## 2014-09-07 DIAGNOSIS — E669 Obesity, unspecified: Secondary | ICD-10-CM | POA: Diagnosis not present

## 2014-09-07 DIAGNOSIS — I2510 Atherosclerotic heart disease of native coronary artery without angina pectoris: Secondary | ICD-10-CM | POA: Diagnosis not present

## 2014-09-10 DIAGNOSIS — Z9181 History of falling: Secondary | ICD-10-CM | POA: Diagnosis not present

## 2014-09-10 DIAGNOSIS — E669 Obesity, unspecified: Secondary | ICD-10-CM | POA: Diagnosis not present

## 2014-09-10 DIAGNOSIS — R269 Unspecified abnormalities of gait and mobility: Secondary | ICD-10-CM | POA: Diagnosis not present

## 2014-09-10 DIAGNOSIS — E785 Hyperlipidemia, unspecified: Secondary | ICD-10-CM | POA: Diagnosis not present

## 2014-09-10 DIAGNOSIS — M159 Polyosteoarthritis, unspecified: Secondary | ICD-10-CM | POA: Diagnosis not present

## 2014-09-10 DIAGNOSIS — I2510 Atherosclerotic heart disease of native coronary artery without angina pectoris: Secondary | ICD-10-CM | POA: Diagnosis not present

## 2014-09-10 DIAGNOSIS — I10 Essential (primary) hypertension: Secondary | ICD-10-CM | POA: Diagnosis not present

## 2014-09-12 DIAGNOSIS — Z9181 History of falling: Secondary | ICD-10-CM | POA: Diagnosis not present

## 2014-09-12 DIAGNOSIS — I10 Essential (primary) hypertension: Secondary | ICD-10-CM | POA: Diagnosis not present

## 2014-09-12 DIAGNOSIS — E669 Obesity, unspecified: Secondary | ICD-10-CM | POA: Diagnosis not present

## 2014-09-12 DIAGNOSIS — E785 Hyperlipidemia, unspecified: Secondary | ICD-10-CM | POA: Diagnosis not present

## 2014-09-12 DIAGNOSIS — R269 Unspecified abnormalities of gait and mobility: Secondary | ICD-10-CM | POA: Diagnosis not present

## 2014-09-12 DIAGNOSIS — I2510 Atherosclerotic heart disease of native coronary artery without angina pectoris: Secondary | ICD-10-CM | POA: Diagnosis not present

## 2014-09-12 DIAGNOSIS — M159 Polyosteoarthritis, unspecified: Secondary | ICD-10-CM | POA: Diagnosis not present

## 2015-01-26 ENCOUNTER — Emergency Department (HOSPITAL_COMMUNITY): Payer: Commercial Managed Care - HMO

## 2015-01-26 ENCOUNTER — Emergency Department (HOSPITAL_COMMUNITY)
Admission: EM | Admit: 2015-01-26 | Discharge: 2015-01-26 | Disposition: A | Discharge status: Home / Self Care | Payer: Commercial Managed Care - HMO | Attending: Emergency Medicine | Admitting: Emergency Medicine

## 2015-01-26 ENCOUNTER — Encounter (HOSPITAL_COMMUNITY): Payer: Self-pay | Admitting: *Deleted

## 2015-01-26 DIAGNOSIS — Z79899 Other long term (current) drug therapy: Secondary | ICD-10-CM | POA: Insufficient documentation

## 2015-01-26 DIAGNOSIS — S79911A Unspecified injury of right hip, initial encounter: Secondary | ICD-10-CM | POA: Diagnosis not present

## 2015-01-26 DIAGNOSIS — S4992XA Unspecified injury of left shoulder and upper arm, initial encounter: Secondary | ICD-10-CM | POA: Diagnosis not present

## 2015-01-26 DIAGNOSIS — I10 Essential (primary) hypertension: Secondary | ICD-10-CM | POA: Diagnosis not present

## 2015-01-26 DIAGNOSIS — Y998 Other external cause status: Secondary | ICD-10-CM | POA: Insufficient documentation

## 2015-01-26 DIAGNOSIS — S8001XA Contusion of right knee, initial encounter: Secondary | ICD-10-CM | POA: Diagnosis not present

## 2015-01-26 DIAGNOSIS — W19XXXA Unspecified fall, initial encounter: Secondary | ICD-10-CM

## 2015-01-26 DIAGNOSIS — Z87891 Personal history of nicotine dependence: Secondary | ICD-10-CM | POA: Insufficient documentation

## 2015-01-26 DIAGNOSIS — Y92008 Other place in unspecified non-institutional (private) residence as the place of occurrence of the external cause: Secondary | ICD-10-CM | POA: Diagnosis not present

## 2015-01-26 DIAGNOSIS — T148 Other injury of unspecified body region: Secondary | ICD-10-CM | POA: Diagnosis not present

## 2015-01-26 DIAGNOSIS — M25512 Pain in left shoulder: Secondary | ICD-10-CM | POA: Diagnosis not present

## 2015-01-26 DIAGNOSIS — Z9889 Other specified postprocedural states: Secondary | ICD-10-CM | POA: Diagnosis not present

## 2015-01-26 DIAGNOSIS — Z7982 Long term (current) use of aspirin: Secondary | ICD-10-CM | POA: Diagnosis not present

## 2015-01-26 DIAGNOSIS — M25561 Pain in right knee: Secondary | ICD-10-CM | POA: Diagnosis not present

## 2015-01-26 DIAGNOSIS — Z880 Allergy status to penicillin: Secondary | ICD-10-CM | POA: Diagnosis not present

## 2015-01-26 DIAGNOSIS — Y9301 Activity, walking, marching and hiking: Secondary | ICD-10-CM | POA: Diagnosis not present

## 2015-01-26 DIAGNOSIS — W010XXA Fall on same level from slipping, tripping and stumbling without subsequent striking against object, initial encounter: Secondary | ICD-10-CM | POA: Insufficient documentation

## 2015-01-26 DIAGNOSIS — R259 Unspecified abnormal involuntary movements: Secondary | ICD-10-CM | POA: Diagnosis not present

## 2015-01-26 DIAGNOSIS — M25551 Pain in right hip: Secondary | ICD-10-CM | POA: Diagnosis not present

## 2015-01-26 DIAGNOSIS — S8991XA Unspecified injury of right lower leg, initial encounter: Secondary | ICD-10-CM | POA: Diagnosis present

## 2015-01-26 LAB — BASIC METABOLIC PANEL
Anion gap: 10 (ref 5–15)
BUN: 20 mg/dL (ref 6–20)
CHLORIDE: 108 mmol/L (ref 101–111)
CO2: 25 mmol/L (ref 22–32)
CREATININE: 0.82 mg/dL (ref 0.44–1.00)
Calcium: 9.4 mg/dL (ref 8.9–10.3)
Glucose, Bld: 130 mg/dL — ABNORMAL HIGH (ref 65–99)
Potassium: 3.5 mmol/L (ref 3.5–5.1)
SODIUM: 143 mmol/L (ref 135–145)

## 2015-01-26 LAB — CBC WITH DIFFERENTIAL/PLATELET
BASOS PCT: 0 %
Basophils Absolute: 0 10*3/uL (ref 0.0–0.1)
EOS ABS: 0 10*3/uL (ref 0.0–0.7)
EOS PCT: 0 %
HCT: 43.1 % (ref 36.0–46.0)
HEMOGLOBIN: 14.7 g/dL (ref 12.0–15.0)
Lymphocytes Relative: 11 %
Lymphs Abs: 1.5 10*3/uL (ref 0.7–4.0)
MCH: 33 pg (ref 26.0–34.0)
MCHC: 34.1 g/dL (ref 30.0–36.0)
MCV: 96.9 fL (ref 78.0–100.0)
MONOS PCT: 10 %
Monocytes Absolute: 1.4 10*3/uL — ABNORMAL HIGH (ref 0.1–1.0)
NEUTROS PCT: 79 %
Neutro Abs: 10.4 10*3/uL — ABNORMAL HIGH (ref 1.7–7.7)
PLATELETS: 273 10*3/uL (ref 150–400)
RBC: 4.45 MIL/uL (ref 3.87–5.11)
RDW: 14.1 % (ref 11.5–15.5)
WBC: 13.3 10*3/uL — AB (ref 4.0–10.5)

## 2015-01-26 LAB — CK: CK TOTAL: 250 U/L — AB (ref 38–234)

## 2015-01-26 MED ORDER — MORPHINE SULFATE (PF) 4 MG/ML IV SOLN
4.0000 mg | Freq: Once | INTRAVENOUS | Status: AC
Start: 2015-01-26 — End: 2015-01-26
  Administered 2015-01-26: 4 mg via INTRAVENOUS
  Filled 2015-01-26: qty 1

## 2015-01-26 MED ORDER — DOCUSATE SODIUM 100 MG PO CAPS: 100.0000 mg | ORAL_CAPSULE | Freq: Two times a day (BID) | ORAL | Status: DC

## 2015-01-26 MED ORDER — HYDROCODONE-ACETAMINOPHEN 5-325 MG PO TABS: 1.0000 | ORAL_TABLET | Freq: Four times a day (QID) | ORAL | Status: DC | PRN

## 2015-01-26 MED ORDER — HYDROCODONE-ACETAMINOPHEN 5-325 MG PO TABS
1.0000 | ORAL_TABLET | Freq: Once | ORAL | Status: AC
  Administered 2015-01-26: 1 via ORAL
  Filled 2015-01-26: qty 1

## 2015-01-26 MED ORDER — ONDANSETRON HCL 4 MG/2ML IJ SOLN
4.0000 mg | Freq: Once | INTRAMUSCULAR | Status: AC
  Administered 2015-01-26: 4 mg via INTRAVENOUS
  Filled 2015-01-26: qty 2

## 2015-01-26 NOTE — ED Notes (Signed)
Bed: WA08 Expected date: 01/26/15 Expected time: 11:44 AM Means of arrival:  Comments: 81 y.o. Fall, right leg pain

## 2015-01-26 NOTE — ED Notes (Signed)
Per EMS pt coming from home with c/o fall early this morning, around 0300 after slipping on the floor, denies LOC, neck/back pain, only c/o pain in left shoulder (which she reports is chronic pain) and pain in right leg. Pt sts chronic right knee pain, however now sts pain is in her entire right leg. Pt also sts she feels sore all over.

## 2015-01-26 NOTE — ED Notes (Signed)
[facility] called for transport.  

## 2015-01-26 NOTE — ED Provider Notes (Addendum)
CSN: [id number]     Arrival date & time 01/26/15  1140 History   First MD Initiated Contact with Patient 01/26/15 1141     Chief Complaint  Patient presents with  . Fall     (Consider location/radiation/quality/duration/timing/severity/associated sxs/prior Treatment) Patient is a 81 y.o. female presenting with fall. The history is provided by the patient.  Fall This is a new (At 3 AM this morning patient was walking back to bed from the bathroom and slipped on the wood floor falling backwards. After falling she had significant pain in her right leg with inability to get up. She crawled around on the floor the rest of the night ) problem. The current episode started 6 to 12 hours ago. The problem occurs constantly. The problem has not changed since onset.Pertinent negatives include no chest pain, no abdominal pain, no headaches and no shortness of breath. Associated symptoms comments: Right knee and leg pain. Inability to walk due to pain. Left shoulder pain. No headache, syncope, head injury, loss of consciousness, neck, back pain. No abdominal pain, nausea, vomiting, diarrhea. No urinary complaints.. The symptoms are aggravated by bending and twisting. The symptoms are relieved by rest. She has tried nothing for the symptoms. The treatment provided no relief.    Past Medical History  Diagnosis Date  . Hypertension   . Morbid obesity (HCC)   . Hyperlipidemia   . Dyspnea    Past Surgical History  Procedure Laterality Date  . Transthoracic echocardiogram  07/12/2008    EF 73%, mild AI, trace MR, PR, and TR  . Cardiovascular stress test  07/12/2008    Evidence of moderate ischemia seen in Basal Anterior, Mid Anterior, Apical Anterior, and Apical regions. EKG is negative for ischemia.  . Cardiac catheterization  08/07/2008    Normal coronary arteries and LV function, mildly elevated R heart pressures-due to morbid obesity, continued on aggresive risk factor management  . Partial hysterectomy   1972   Family History  Problem Relation Age of Onset  . Cancer Mother   . Heart attack Father   . Heart failure Maternal Grandmother   . Heart failure Maternal Grandfather    Social History  Substance Use Topics  . Smoking status: Former Games developer  . Smokeless tobacco: Never Used     Comment: age 55, smoked 1 year  . Alcohol Use: No   OB History    No data available     Review of Systems  Respiratory: Negative for shortness of breath.   Cardiovascular: Negative for chest pain.  Gastrointestinal: Negative for abdominal pain.  Genitourinary: Negative for dysuria.  Neurological: Negative for headaches.  All other systems reviewed and are negative.     Allergies  Penicillins  Home Medications   Prior to Admission medications   Medication Sig Start Date End Date Taking? Authorizing Provider  aspirin 325 MG tablet Take 325 mg by mouth every morning.    Yes Historical Provider, MD  Cholecalciferol 1000 UNITS capsule Take 1,000 Units by mouth daily.   Yes Historical Provider, MD  potassium chloride (MICRO-K) 10 MEQ CR capsule Take 1 capsule by mouth every morning.  12/12/12  Yes Historical Provider, MD  traMADol-acetaminophen (ULTRACET) 37.5-325 MG per tablet Take 1-2 tablets by mouth every 6 (six) hours as needed for severe pain.    Yes Historical Provider, MD   BP 143/72 mmHg  Pulse 106  Temp(Src) 98 F (36.7 C) (Oral)  Resp 18  SpO2 100% Physical Exam  Constitutional: She is  oriented to person, place, and time. She appears well-developed and well-nourished. No distress.  HENT:  Head: Normocephalic and atraumatic.  Mouth/Throat: Oropharynx is clear and moist.  Eyes: Conjunctivae and EOM are normal. Pupils are equal, round, and reactive to light.  Neck: Normal range of motion. Neck supple. No spinous process tenderness and no muscular tenderness present. Normal range of motion present.  Cardiovascular: Normal rate, regular rhythm and intact distal pulses.   No murmur  heard. Pulmonary/Chest: Effort normal and breath sounds normal. No respiratory distress. She has no wheezes. She has no rales.  Abdominal: Soft. She exhibits no distension. There is no tenderness. There is no rebound and no guarding.  Musculoskeletal: She exhibits no edema.       Left shoulder: She exhibits decreased range of motion, tenderness, bony tenderness and pain. She exhibits normal pulse and normal strength.       Right hip: She exhibits decreased range of motion and tenderness.       Right knee: She exhibits decreased range of motion. She exhibits no deformity. Tenderness found. Medial joint line and lateral joint line tenderness noted.       Arms: Neurological: She is alert and oriented to person, place, and time.  Skin: Skin is warm and dry. No rash noted. No erythema.  Psychiatric: She has a normal mood and affect. Her behavior is normal.  Nursing note and vitals reviewed.   ED Course  Procedures (including critical care time) Labs Review Labs Reviewed  CBC WITH DIFFERENTIAL/PLATELET - Abnormal; Notable for the following:    WBC 13.3 (*)    Neutro Abs 10.4 (*)    Monocytes Absolute 1.4 (*)    All other components within normal limits  BASIC METABOLIC PANEL - Abnormal; Notable for the following:    Glucose, Bld 130 (*)    All other components within normal limits  CK - Abnormal; Notable for the following:    Total CK 250 (*)    All other components within normal limits    Imaging Review Ct Knee Right Wo Contrast  01/26/2015  CLINICAL DATA:  Right knee pain after a fall today. EXAM: CT OF THE RIGHT KNEE WITHOUT CONTRAST TECHNIQUE: Multidetector CT imaging of the right knee was performed according to the standard protocol. Multiplanar CT image reconstructions were also generated. COMPARISON:  Radiographs dated 01/26/2015 FINDINGS: There is no acute fracture or dislocation or joint effusion. There is fairly severe tricompartmental osteoarthritis. There are numerous tiny  subcortical and cortical erosions and cysts in the medial and lateral compartments and in the patellofemoral compartment. There are tiny marginal osteophytes along the rim of the medial tibial plateau with a giant sent osteopenia of plates simulates a fracture. However, there is no edema or fluid adjacent to this area. There is tricompartmental joint space narrowing. Cruciate ligaments and collateral ligaments are intact. Menisci are not well enough seen for evaluation. IMPRESSION: No fracture or other acute abnormality. Tricompartmental osteoarthritis. Electronically Signed   By: Francene Boyers M.D.   On: 01/26/2015 14:50   Dg Shoulder Left  01/26/2015  CLINICAL DATA:  Fall this morning slipping on floor landing on back. Left shoulder pain. EXAM: LEFT SHOULDER - 2+ VIEW COMPARISON:  Chest x-ray 08/02/2008 FINDINGS: Exam demonstrates severe degenerative change of the glenohumeral joint and moderate degenerative change of the [facility] joint. No evidence of acute fracture or dislocation. IMPRESSION: No acute findings. Moderate degenerate change of the [facility] joint and severe degenerative change of the glenohumeral joint. Electronically Signed  By: Elberta Fortisaniel  Boyle M.D.   On: 01/26/2015 13:31   Dg Knee Complete 4 Views Right  01/26/2015  CLINICAL DATA:  Fall this morning slipping on floor in landing on back. Right knee and hip pain. EXAM: RIGHT KNEE - COMPLETE 4+ VIEW COMPARISON:  02/01/2010 FINDINGS: Exam demonstrates diffuse decreased bone mineralization. There are mild degenerative changes most prominent over the patellofemoral joint. No evidence of joint effusion. Possible subtle fracture along the posterior medial aspect of the tibial plateau. IMPRESSION: Possible subtle fracture along the posterior medial aspect of the tibial plateau. Electronically Signed   By: Elberta Fortisaniel  Boyle M.D.   On: 01/26/2015 13:34   Dg Hip Unilat With Pelvis 2-3 Views Right  01/26/2015  CLINICAL DATA:  Larey SeatFell this morning at home, slipped on  floor, landed on back side, RIGHT lateral hip pain, history osteoarthritis EXAM: DG HIP (WITH OR WITHOUT PELVIS) 2-3V RIGHT COMPARISON:  None FINDINGS: Osseous demineralization diffusely. Marked osteoarthritic changes RIGHT hip joint with joint space narrowing, subchondral sclerosis, mild subchondral cyst formation, and spurring. No acute fracture, dislocation or bone destruction. Minimal asymmetric sclerosis of RIGHT SI joint versus LEFT. Mild narrowing of LEFT hip joint. IMPRESSION: Advanced osteoarthritic changes of RIGHT hip joint. Osseous demineralization with LEFT hip degenerative changes and question mild asymmetric RIGHT sacroiliitis. No acute bony abnormalities. Electronically Signed   By: Ulyses SouthwardMark  Boles M.D.   On: 01/26/2015 13:31   I have personally reviewed and evaluated these images and lab results as part of my medical decision-making.   EKG Interpretation None      MDM   Final diagnoses:  Fall, initial encounter  Knee contusion, right, initial encounter   patient is 81 year old female who presents today after a fall that occurred around 3 AM this morning. She states that her right knee and hip hurt so badly she was not able to get up off the floor. She crawled around on the floor until 10:00 this morning when she was able to crawl to her walker and call her family. Patient denies any head injury or loss of consciousness. She fell flat on her back because she slipped on the wood floor. She denies any back or neck pain. She has chronic pain of the left shoulder but she states it's worse after the fall. She has some mild diffuse muscle aches but denies any chest pain, palpitations, shortness of breath, abdominal pain, non-nausea or vomiting.  On exam patient has significant pain when ranging the right leg most localized over the right knee. Full range of motion of the left leg with only mild muscle soreness. Also significant tenderness of the left shoulder which sounds like is chronic but  may be worse today.  Will ensure patient does not have rhabdomyolysis from prolonged stay on the floor. She denies any urinary symptoms concerning for a UTI. CBC, BMP, CK pending. Concern for possible knee injury versus hip fracture. Right hip and knee images pending. Left shoulder images pending. Patient given pain control.  1:59 PM Patient's labs without significant finding other than a CK of 250. Imaging shows concern for possible right-sided tibial plateau fracture.  Will do a CT to further evaluate  3:00 PM CT neg for fracture.  Pt has wheelchair at home and will give paincontrol.  Gwyneth SproutWhitney Javonna Balli, MD 01/26/15 1501  Gwyneth SproutWhitney Amilio Zehnder, MD 01/26/15 1527

## 2015-01-26 NOTE — Discharge Instructions (Signed)

## 2015-01-26 NOTE — ED Notes (Signed)
Patient was alert, oriented and stable upon discharge. RN went over AVS and patient had no further questions. Discharge paperwork given to family.

## 2015-01-29 DIAGNOSIS — I10 Essential (primary) hypertension: Secondary | ICD-10-CM | POA: Diagnosis not present

## 2015-01-29 DIAGNOSIS — Z23 Encounter for immunization: Secondary | ICD-10-CM | POA: Diagnosis not present

## 2015-01-29 DIAGNOSIS — W06XXXD Fall from bed, subsequent encounter: Secondary | ICD-10-CM | POA: Diagnosis not present

## 2015-01-29 DIAGNOSIS — R296 Repeated falls: Secondary | ICD-10-CM | POA: Diagnosis not present

## 2015-02-11 ENCOUNTER — Encounter: Payer: Self-pay | Admitting: Internal Medicine

## 2015-03-05 DIAGNOSIS — R296 Repeated falls: Secondary | ICD-10-CM | POA: Diagnosis not present

## 2015-03-05 DIAGNOSIS — R2689 Other abnormalities of gait and mobility: Secondary | ICD-10-CM | POA: Diagnosis not present

## 2015-03-05 DIAGNOSIS — M6281 Muscle weakness (generalized): Secondary | ICD-10-CM | POA: Diagnosis not present

## 2015-03-05 DIAGNOSIS — R7303 Prediabetes: Secondary | ICD-10-CM | POA: Diagnosis not present

## 2015-03-07 DIAGNOSIS — I2510 Atherosclerotic heart disease of native coronary artery without angina pectoris: Secondary | ICD-10-CM | POA: Diagnosis not present

## 2015-03-07 DIAGNOSIS — R296 Repeated falls: Secondary | ICD-10-CM | POA: Diagnosis not present

## 2015-03-07 DIAGNOSIS — R7303 Prediabetes: Secondary | ICD-10-CM | POA: Diagnosis not present

## 2015-03-07 DIAGNOSIS — M6281 Muscle weakness (generalized): Secondary | ICD-10-CM | POA: Diagnosis not present

## 2015-03-07 DIAGNOSIS — I10 Essential (primary) hypertension: Secondary | ICD-10-CM | POA: Diagnosis not present

## 2015-03-07 DIAGNOSIS — R2689 Other abnormalities of gait and mobility: Secondary | ICD-10-CM | POA: Diagnosis not present

## 2015-03-12 DIAGNOSIS — R7303 Prediabetes: Secondary | ICD-10-CM | POA: Diagnosis not present

## 2015-03-12 DIAGNOSIS — I2510 Atherosclerotic heart disease of native coronary artery without angina pectoris: Secondary | ICD-10-CM | POA: Diagnosis not present

## 2015-03-12 DIAGNOSIS — R296 Repeated falls: Secondary | ICD-10-CM | POA: Diagnosis not present

## 2015-03-12 DIAGNOSIS — M6281 Muscle weakness (generalized): Secondary | ICD-10-CM | POA: Diagnosis not present

## 2015-03-12 DIAGNOSIS — R2689 Other abnormalities of gait and mobility: Secondary | ICD-10-CM | POA: Diagnosis not present

## 2015-03-12 DIAGNOSIS — I10 Essential (primary) hypertension: Secondary | ICD-10-CM | POA: Diagnosis not present

## 2015-03-14 DIAGNOSIS — R2689 Other abnormalities of gait and mobility: Secondary | ICD-10-CM | POA: Diagnosis not present

## 2015-03-14 DIAGNOSIS — R296 Repeated falls: Secondary | ICD-10-CM | POA: Diagnosis not present

## 2015-03-14 DIAGNOSIS — R7303 Prediabetes: Secondary | ICD-10-CM | POA: Diagnosis not present

## 2015-03-14 DIAGNOSIS — I2510 Atherosclerotic heart disease of native coronary artery without angina pectoris: Secondary | ICD-10-CM | POA: Diagnosis not present

## 2015-03-14 DIAGNOSIS — M6281 Muscle weakness (generalized): Secondary | ICD-10-CM | POA: Diagnosis not present

## 2015-03-14 DIAGNOSIS — I10 Essential (primary) hypertension: Secondary | ICD-10-CM | POA: Diagnosis not present

## 2015-03-17 DIAGNOSIS — R296 Repeated falls: Secondary | ICD-10-CM | POA: Diagnosis not present

## 2015-03-17 DIAGNOSIS — M6281 Muscle weakness (generalized): Secondary | ICD-10-CM | POA: Diagnosis not present

## 2015-03-17 DIAGNOSIS — I2510 Atherosclerotic heart disease of native coronary artery without angina pectoris: Secondary | ICD-10-CM | POA: Diagnosis not present

## 2015-03-17 DIAGNOSIS — R7303 Prediabetes: Secondary | ICD-10-CM | POA: Diagnosis not present

## 2015-03-17 DIAGNOSIS — I10 Essential (primary) hypertension: Secondary | ICD-10-CM | POA: Diagnosis not present

## 2015-03-17 DIAGNOSIS — R2689 Other abnormalities of gait and mobility: Secondary | ICD-10-CM | POA: Diagnosis not present

## 2015-03-18 ENCOUNTER — Ambulatory Visit: Payer: Medicare PPO | Admitting: Internal Medicine

## 2015-03-19 DIAGNOSIS — R7303 Prediabetes: Secondary | ICD-10-CM | POA: Diagnosis not present

## 2015-03-19 DIAGNOSIS — M6281 Muscle weakness (generalized): Secondary | ICD-10-CM | POA: Diagnosis not present

## 2015-03-19 DIAGNOSIS — I10 Essential (primary) hypertension: Secondary | ICD-10-CM | POA: Diagnosis not present

## 2015-03-19 DIAGNOSIS — R296 Repeated falls: Secondary | ICD-10-CM | POA: Diagnosis not present

## 2015-03-19 DIAGNOSIS — R2689 Other abnormalities of gait and mobility: Secondary | ICD-10-CM | POA: Diagnosis not present

## 2015-03-19 DIAGNOSIS — I2510 Atherosclerotic heart disease of native coronary artery without angina pectoris: Secondary | ICD-10-CM | POA: Diagnosis not present

## 2015-03-20 DIAGNOSIS — R7303 Prediabetes: Secondary | ICD-10-CM | POA: Diagnosis not present

## 2015-03-20 DIAGNOSIS — I2510 Atherosclerotic heart disease of native coronary artery without angina pectoris: Secondary | ICD-10-CM | POA: Diagnosis not present

## 2015-03-20 DIAGNOSIS — R2689 Other abnormalities of gait and mobility: Secondary | ICD-10-CM | POA: Diagnosis not present

## 2015-03-20 DIAGNOSIS — I10 Essential (primary) hypertension: Secondary | ICD-10-CM | POA: Diagnosis not present

## 2015-03-20 DIAGNOSIS — M6281 Muscle weakness (generalized): Secondary | ICD-10-CM | POA: Diagnosis not present

## 2015-03-20 DIAGNOSIS — R296 Repeated falls: Secondary | ICD-10-CM | POA: Diagnosis not present

## 2015-03-25 DIAGNOSIS — M6281 Muscle weakness (generalized): Secondary | ICD-10-CM | POA: Diagnosis not present

## 2015-03-25 DIAGNOSIS — R296 Repeated falls: Secondary | ICD-10-CM | POA: Diagnosis not present

## 2015-03-25 DIAGNOSIS — I10 Essential (primary) hypertension: Secondary | ICD-10-CM | POA: Diagnosis not present

## 2015-03-25 DIAGNOSIS — I2510 Atherosclerotic heart disease of native coronary artery without angina pectoris: Secondary | ICD-10-CM | POA: Diagnosis not present

## 2015-03-25 DIAGNOSIS — R2689 Other abnormalities of gait and mobility: Secondary | ICD-10-CM | POA: Diagnosis not present

## 2015-03-25 DIAGNOSIS — R7303 Prediabetes: Secondary | ICD-10-CM | POA: Diagnosis not present

## 2015-03-26 DIAGNOSIS — R2689 Other abnormalities of gait and mobility: Secondary | ICD-10-CM | POA: Diagnosis not present

## 2015-03-26 DIAGNOSIS — I2510 Atherosclerotic heart disease of native coronary artery without angina pectoris: Secondary | ICD-10-CM | POA: Diagnosis not present

## 2015-03-26 DIAGNOSIS — I10 Essential (primary) hypertension: Secondary | ICD-10-CM | POA: Diagnosis not present

## 2015-03-26 DIAGNOSIS — R296 Repeated falls: Secondary | ICD-10-CM | POA: Diagnosis not present

## 2015-03-26 DIAGNOSIS — R7303 Prediabetes: Secondary | ICD-10-CM | POA: Diagnosis not present

## 2015-03-26 DIAGNOSIS — M6281 Muscle weakness (generalized): Secondary | ICD-10-CM | POA: Diagnosis not present

## 2015-03-27 DIAGNOSIS — R7303 Prediabetes: Secondary | ICD-10-CM | POA: Diagnosis not present

## 2015-03-27 DIAGNOSIS — M6281 Muscle weakness (generalized): Secondary | ICD-10-CM | POA: Diagnosis not present

## 2015-03-27 DIAGNOSIS — R296 Repeated falls: Secondary | ICD-10-CM | POA: Diagnosis not present

## 2015-03-27 DIAGNOSIS — R2689 Other abnormalities of gait and mobility: Secondary | ICD-10-CM | POA: Diagnosis not present

## 2015-03-27 DIAGNOSIS — I2510 Atherosclerotic heart disease of native coronary artery without angina pectoris: Secondary | ICD-10-CM | POA: Diagnosis not present

## 2015-03-27 DIAGNOSIS — I10 Essential (primary) hypertension: Secondary | ICD-10-CM | POA: Diagnosis not present

## 2015-04-01 DIAGNOSIS — M6281 Muscle weakness (generalized): Secondary | ICD-10-CM | POA: Diagnosis not present

## 2015-04-01 DIAGNOSIS — R296 Repeated falls: Secondary | ICD-10-CM | POA: Diagnosis not present

## 2015-04-01 DIAGNOSIS — R7303 Prediabetes: Secondary | ICD-10-CM | POA: Diagnosis not present

## 2015-04-01 DIAGNOSIS — I2510 Atherosclerotic heart disease of native coronary artery without angina pectoris: Secondary | ICD-10-CM | POA: Diagnosis not present

## 2015-04-01 DIAGNOSIS — I10 Essential (primary) hypertension: Secondary | ICD-10-CM | POA: Diagnosis not present

## 2015-04-01 DIAGNOSIS — R2689 Other abnormalities of gait and mobility: Secondary | ICD-10-CM | POA: Diagnosis not present

## 2015-04-09 DIAGNOSIS — R7303 Prediabetes: Secondary | ICD-10-CM | POA: Diagnosis not present

## 2015-04-09 DIAGNOSIS — R296 Repeated falls: Secondary | ICD-10-CM | POA: Diagnosis not present

## 2015-04-09 DIAGNOSIS — M6281 Muscle weakness (generalized): Secondary | ICD-10-CM | POA: Diagnosis not present

## 2015-04-09 DIAGNOSIS — I10 Essential (primary) hypertension: Secondary | ICD-10-CM | POA: Diagnosis not present

## 2015-04-09 DIAGNOSIS — R2689 Other abnormalities of gait and mobility: Secondary | ICD-10-CM | POA: Diagnosis not present

## 2015-04-09 DIAGNOSIS — I2510 Atherosclerotic heart disease of native coronary artery without angina pectoris: Secondary | ICD-10-CM | POA: Diagnosis not present

## 2015-04-11 DIAGNOSIS — R7303 Prediabetes: Secondary | ICD-10-CM | POA: Diagnosis not present

## 2015-04-11 DIAGNOSIS — I10 Essential (primary) hypertension: Secondary | ICD-10-CM | POA: Diagnosis not present

## 2015-04-11 DIAGNOSIS — R296 Repeated falls: Secondary | ICD-10-CM | POA: Diagnosis not present

## 2015-04-11 DIAGNOSIS — I2510 Atherosclerotic heart disease of native coronary artery without angina pectoris: Secondary | ICD-10-CM | POA: Diagnosis not present

## 2015-04-11 DIAGNOSIS — M6281 Muscle weakness (generalized): Secondary | ICD-10-CM | POA: Diagnosis not present

## 2015-04-11 DIAGNOSIS — R2689 Other abnormalities of gait and mobility: Secondary | ICD-10-CM | POA: Diagnosis not present

## 2015-04-15 DIAGNOSIS — I2510 Atherosclerotic heart disease of native coronary artery without angina pectoris: Secondary | ICD-10-CM | POA: Diagnosis not present

## 2015-04-15 DIAGNOSIS — I10 Essential (primary) hypertension: Secondary | ICD-10-CM | POA: Diagnosis not present

## 2015-04-15 DIAGNOSIS — R2689 Other abnormalities of gait and mobility: Secondary | ICD-10-CM | POA: Diagnosis not present

## 2015-04-15 DIAGNOSIS — R7303 Prediabetes: Secondary | ICD-10-CM | POA: Diagnosis not present

## 2015-04-15 DIAGNOSIS — R296 Repeated falls: Secondary | ICD-10-CM | POA: Diagnosis not present

## 2015-04-15 DIAGNOSIS — M6281 Muscle weakness (generalized): Secondary | ICD-10-CM | POA: Diagnosis not present

## 2015-04-17 DIAGNOSIS — I10 Essential (primary) hypertension: Secondary | ICD-10-CM | POA: Diagnosis not present

## 2015-04-17 DIAGNOSIS — R296 Repeated falls: Secondary | ICD-10-CM | POA: Diagnosis not present

## 2015-04-17 DIAGNOSIS — I2510 Atherosclerotic heart disease of native coronary artery without angina pectoris: Secondary | ICD-10-CM | POA: Diagnosis not present

## 2015-04-17 DIAGNOSIS — R2689 Other abnormalities of gait and mobility: Secondary | ICD-10-CM | POA: Diagnosis not present

## 2015-04-17 DIAGNOSIS — M6281 Muscle weakness (generalized): Secondary | ICD-10-CM | POA: Diagnosis not present

## 2015-04-17 DIAGNOSIS — R7303 Prediabetes: Secondary | ICD-10-CM | POA: Diagnosis not present

## 2015-04-18 ENCOUNTER — Ambulatory Visit (INDEPENDENT_AMBULATORY_CARE_PROVIDER_SITE_OTHER): Payer: Commercial Managed Care - HMO | Admitting: Internal Medicine

## 2015-04-18 ENCOUNTER — Encounter: Payer: Self-pay | Admitting: Internal Medicine

## 2015-04-18 VITALS — BP 136/76 | HR 87 | Ht 68.0 in | Wt 244.7 lb

## 2015-04-18 DIAGNOSIS — R0609 Other forms of dyspnea: Secondary | ICD-10-CM | POA: Diagnosis not present

## 2015-04-18 DIAGNOSIS — I10 Essential (primary) hypertension: Secondary | ICD-10-CM

## 2015-04-18 MED ORDER — ASPIRIN 81 MG PO TABS: 81.0000 mg | ORAL_TABLET | Freq: Every day | ORAL | Status: DC

## 2015-04-18 NOTE — Progress Notes (Signed)
OFFICE NOTE  Chief Complaint:  Routine follow-up, DOE  Primary Care Physician: Gwynneth AlimentSANDERS,ROBYN N, MD  HPI:  Brianna David is a pleasant 82 year old female with a history of hypertension, morbid obesity, and chronic dyspnea. Just as mild dyslipidemia but is not on a statin. I last saw her over year ago and she been having some problems with nausea and vomiting and had had blood work which revealed a marked leukocytosis. This apparently resolved as did her symptoms. Since then she's been doing fairly well. She has a significant sedentary lifestyle and reports not being able to exercise due to bilateral knee pain. For some reason she's managed to lose about 20 pounds, mostly due to decreasing her food intake. She was unaware that she could do upper extremity exercises even though her knees bother her. She currently denies any chest pain.  Brianna David returns today and is doing fairly well. Her blood pressure is a little elevated however she reports being in pain. She is out of her pain medicine and has significant arthritis in her right knee. She recently had an injection in the knee and her hip. She is going to transition her care with Dr. Renae GlossShelton to another provider in the practice. She is interested in a flu vaccine today.   I saw Brianna David back today in the office. Overall she feels well although does still get short of breath with some exertion. It's been more difficult for her to get around mostly due to her weight and problems with knees. Her husband reports that they're going to try to get a new walker for her today. Weight is now up to 244 from 235. Her shortness of breath is generally stable. Blood pressure is well-controlled today. She did tell me that she had a fall over Christmas. I reviewed her medicines and see that she is on full dose aspirin , presumably for primary prevention as she has no known coronary history.  PMHx:  Past Medical History  Diagnosis Date  .  Hypertension   . Morbid obesity (HCC)   . Hyperlipidemia   . Dyspnea     Past Surgical History  Procedure Laterality Date  . Transthoracic echocardiogram  07/12/2008    EF 73%, mild AI, trace MR, PR, and TR  . Cardiovascular stress test  07/12/2008    Evidence of moderate ischemia seen in Basal Anterior, Mid Anterior, Apical Anterior, and Apical regions. EKG is negative for ischemia.  . Cardiac catheterization  08/07/2008    Normal coronary arteries and LV function, mildly elevated R heart pressures-due to morbid obesity, continued on aggresive risk factor management  . Partial hysterectomy  1972    FAMHx:  Family History  Problem Relation Age of Onset  . Cancer Mother   . Heart attack Father   . Heart failure Maternal Grandmother   . Heart failure Maternal Grandfather     SOCHx:   reports that she has quit smoking. She has never used smokeless tobacco. She reports that she does not drink alcohol or use illicit drugs.  ALLERGIES:  Allergies  Allergen Reactions  . Penicillins Other (See Comments)    Unknown childhood allergy    ROS: A comprehensive review of systems was negative except for: Respiratory: positive for dyspnea on exertion Musculoskeletal: positive for arthralgias  HOME MEDS: Current Outpatient Prescriptions  Medication Sig Dispense Refill  . Cholecalciferol 1000 UNITS capsule Take 1,000 Units by mouth daily.    Marland Kitchen. HYDROcodone-acetaminophen (NORCO/VICODIN) 5-325 MG tablet Take 1  tablet by mouth every 6 (six) hours as needed. 20 tablet 0  . losartan-hydrochlorothiazide (HYZAAR) 100-25 MG tablet Take 1 tablet by mouth daily.    . potassium chloride (MICRO-K) 10 MEQ CR capsule Take 1 capsule by mouth every morning.     . traMADol-acetaminophen (ULTRACET) 37.5-325 MG per tablet Take 1-2 tablets by mouth every 6 (six) hours as needed for severe pain.     Marland Kitchen aspirin 81 MG tablet Take 1 tablet (81 mg total) by mouth daily. 30 tablet 0   No current facility-administered  medications for this visit.    LABS/IMAGING: No results found for this or any previous visit (from the past 48 hour(s)). No results found.  VITALS: BP 136/76 mmHg  Pulse 87  Ht 5\' 8"  (1.727 m)  Wt 244 lb 11.2 oz (110.995 kg)  BMI 37.22 kg/m2  EXAM: General appearance: alert and no distress Neck: no carotid bruit and no JVD Lungs: clear to auscultation bilaterally Heart: regular rate and rhythm, S1, S2 normal, no murmur, click, rub or gallop Abdomen: soft, non-tender; bowel sounds normal; no masses,  no organomegaly and morbidly obese Extremities: edema trace bilateral LE edema Pulses: 2+ and symmetric Skin: Skin color, texture, turgor normal. No rashes or lesions Neurologic: Grossly normal Psych: Mood, affect normal  EKG: Normal sinus rhythm at 87  ASSESSMENT: 1. Morbid obesity - weight gain 2. Hypertension 3. Dyslipidemia-recently assessed by primary care provider 4. Chronic bilateral knee pain 5. Sedentary lifestyle  PLAN: 1.   Brianna David  Has well-controlled hypertension. I recommend continuing her Hyzaar. With regards her recent fall, may be safer for her to be on low-dose aspirin. There is no data to suggest that the full dose aspirin is less effective than low-dose aspirin a primary prevention of coronary disease. However the bleeding risk is higher on 325 mg. I recommended decreasing aspirin 81 mg daily today.  Her husband also asked if it would be safe to use probiotics and I do not see any problem with that. Apparently she's been having some "gas pains" which seem to improve with Gas-X or similar medication. She should discuss this with her primary care provider.  Follow-up annually.  Chrystie Nose, MD, [facility] Attending Cardiologist [facility]  Lisette Abu [facility] 04/18/2015, 10:18 AM

## 2015-04-18 NOTE — Patient Instructions (Signed)
Your physician has recommended you make the following change in your medication: DECREASE ASPIRIN TO 81 MG DAILY  Your physician recommends that you schedule a follow-up appointment in: ONE YEAR WITH DR. HILTY

## 2015-04-21 DIAGNOSIS — M6281 Muscle weakness (generalized): Secondary | ICD-10-CM | POA: Diagnosis not present

## 2015-04-21 DIAGNOSIS — I10 Essential (primary) hypertension: Secondary | ICD-10-CM | POA: Diagnosis not present

## 2015-04-21 DIAGNOSIS — R296 Repeated falls: Secondary | ICD-10-CM | POA: Diagnosis not present

## 2015-04-21 DIAGNOSIS — R2689 Other abnormalities of gait and mobility: Secondary | ICD-10-CM | POA: Diagnosis not present

## 2015-04-21 DIAGNOSIS — I2510 Atherosclerotic heart disease of native coronary artery without angina pectoris: Secondary | ICD-10-CM | POA: Diagnosis not present

## 2015-04-21 DIAGNOSIS — R7303 Prediabetes: Secondary | ICD-10-CM | POA: Diagnosis not present

## 2015-04-23 DIAGNOSIS — R7303 Prediabetes: Secondary | ICD-10-CM | POA: Diagnosis not present

## 2015-04-23 DIAGNOSIS — I10 Essential (primary) hypertension: Secondary | ICD-10-CM | POA: Diagnosis not present

## 2015-04-23 DIAGNOSIS — R2689 Other abnormalities of gait and mobility: Secondary | ICD-10-CM | POA: Diagnosis not present

## 2015-04-23 DIAGNOSIS — M6281 Muscle weakness (generalized): Secondary | ICD-10-CM | POA: Diagnosis not present

## 2015-04-23 DIAGNOSIS — R296 Repeated falls: Secondary | ICD-10-CM | POA: Diagnosis not present

## 2015-04-23 DIAGNOSIS — I2510 Atherosclerotic heart disease of native coronary artery without angina pectoris: Secondary | ICD-10-CM | POA: Diagnosis not present

## 2015-04-28 DIAGNOSIS — M6281 Muscle weakness (generalized): Secondary | ICD-10-CM | POA: Diagnosis not present

## 2015-04-28 DIAGNOSIS — I10 Essential (primary) hypertension: Secondary | ICD-10-CM | POA: Diagnosis not present

## 2015-04-28 DIAGNOSIS — R7303 Prediabetes: Secondary | ICD-10-CM | POA: Diagnosis not present

## 2015-04-28 DIAGNOSIS — I2510 Atherosclerotic heart disease of native coronary artery without angina pectoris: Secondary | ICD-10-CM | POA: Diagnosis not present

## 2015-04-28 DIAGNOSIS — R296 Repeated falls: Secondary | ICD-10-CM | POA: Diagnosis not present

## 2015-04-28 DIAGNOSIS — R2689 Other abnormalities of gait and mobility: Secondary | ICD-10-CM | POA: Diagnosis not present

## 2015-05-01 DIAGNOSIS — Z741 Need for assistance with personal care: Secondary | ICD-10-CM | POA: Diagnosis not present

## 2015-05-01 DIAGNOSIS — R7309 Other abnormal glucose: Secondary | ICD-10-CM | POA: Diagnosis not present

## 2015-05-01 DIAGNOSIS — M791 Myalgia: Secondary | ICD-10-CM | POA: Diagnosis not present

## 2015-05-01 DIAGNOSIS — I10 Essential (primary) hypertension: Secondary | ICD-10-CM | POA: Diagnosis not present

## 2015-05-01 DIAGNOSIS — E782 Mixed hyperlipidemia: Secondary | ICD-10-CM | POA: Diagnosis not present

## 2015-05-03 DIAGNOSIS — R296 Repeated falls: Secondary | ICD-10-CM | POA: Diagnosis not present

## 2015-05-03 DIAGNOSIS — R2689 Other abnormalities of gait and mobility: Secondary | ICD-10-CM | POA: Diagnosis not present

## 2015-05-03 DIAGNOSIS — M6281 Muscle weakness (generalized): Secondary | ICD-10-CM | POA: Diagnosis not present

## 2015-05-03 DIAGNOSIS — I10 Essential (primary) hypertension: Secondary | ICD-10-CM | POA: Diagnosis not present

## 2015-05-03 DIAGNOSIS — I2510 Atherosclerotic heart disease of native coronary artery without angina pectoris: Secondary | ICD-10-CM | POA: Diagnosis not present

## 2015-05-03 DIAGNOSIS — R7303 Prediabetes: Secondary | ICD-10-CM | POA: Diagnosis not present

## 2015-07-31 DIAGNOSIS — M179 Osteoarthritis of knee, unspecified: Secondary | ICD-10-CM | POA: Diagnosis not present

## 2015-07-31 DIAGNOSIS — I10 Essential (primary) hypertension: Secondary | ICD-10-CM | POA: Diagnosis not present

## 2015-08-13 DIAGNOSIS — M19012 Primary osteoarthritis, left shoulder: Secondary | ICD-10-CM | POA: Diagnosis not present

## 2015-08-13 DIAGNOSIS — M1712 Unilateral primary osteoarthritis, left knee: Secondary | ICD-10-CM | POA: Diagnosis not present

## 2015-08-13 DIAGNOSIS — M1711 Unilateral primary osteoarthritis, right knee: Secondary | ICD-10-CM | POA: Diagnosis not present

## 2015-09-10 DIAGNOSIS — M1711 Unilateral primary osteoarthritis, right knee: Secondary | ICD-10-CM | POA: Diagnosis not present

## 2015-09-10 DIAGNOSIS — M1712 Unilateral primary osteoarthritis, left knee: Secondary | ICD-10-CM | POA: Diagnosis not present

## 2015-10-13 DIAGNOSIS — M25551 Pain in right hip: Secondary | ICD-10-CM | POA: Diagnosis not present

## 2015-11-04 DIAGNOSIS — M1611 Unilateral primary osteoarthritis, right hip: Secondary | ICD-10-CM | POA: Diagnosis not present

## 2015-11-05 DIAGNOSIS — Z741 Need for assistance with personal care: Secondary | ICD-10-CM | POA: Diagnosis not present

## 2015-11-05 DIAGNOSIS — R296 Repeated falls: Secondary | ICD-10-CM | POA: Diagnosis not present

## 2015-11-05 DIAGNOSIS — E559 Vitamin D deficiency, unspecified: Secondary | ICD-10-CM | POA: Diagnosis not present

## 2015-11-05 DIAGNOSIS — M153 Secondary multiple arthritis: Secondary | ICD-10-CM | POA: Diagnosis not present

## 2015-11-05 DIAGNOSIS — Z79899 Other long term (current) drug therapy: Secondary | ICD-10-CM | POA: Diagnosis not present

## 2015-11-05 DIAGNOSIS — Z0000 Encounter for general adult medical examination without abnormal findings: Secondary | ICD-10-CM | POA: Diagnosis not present

## 2015-11-05 DIAGNOSIS — I10 Essential (primary) hypertension: Secondary | ICD-10-CM | POA: Diagnosis not present

## 2016-01-29 ENCOUNTER — Other Ambulatory Visit: Payer: Self-pay | Admitting: Nurse Practitioner

## 2016-01-29 DIAGNOSIS — E2839 Other primary ovarian failure: Secondary | ICD-10-CM

## 2016-01-29 DIAGNOSIS — E559 Vitamin D deficiency, unspecified: Secondary | ICD-10-CM

## 2016-02-03 ENCOUNTER — Ambulatory Visit
Admission: RE | Admit: 2016-02-03 | Discharge: 2016-02-03 | Disposition: A | Discharge status: Home / Self Care | Payer: Commercial Managed Care - HMO | Source: Ambulatory Visit | Attending: Nurse Practitioner | Admitting: Nurse Practitioner

## 2016-02-03 DIAGNOSIS — Z780 Asymptomatic menopausal state: Secondary | ICD-10-CM | POA: Diagnosis not present

## 2016-02-03 DIAGNOSIS — E2839 Other primary ovarian failure: Secondary | ICD-10-CM

## 2016-02-03 DIAGNOSIS — E559 Vitamin D deficiency, unspecified: Secondary | ICD-10-CM

## 2016-02-03 DIAGNOSIS — Z13820 Encounter for screening for osteoporosis: Secondary | ICD-10-CM | POA: Diagnosis not present

## 2016-04-15 ENCOUNTER — Ambulatory Visit (INDEPENDENT_AMBULATORY_CARE_PROVIDER_SITE_OTHER): Payer: Medicare HMO | Admitting: Internal Medicine

## 2016-04-15 ENCOUNTER — Encounter: Payer: Self-pay | Admitting: Internal Medicine

## 2016-04-15 DIAGNOSIS — R0609 Other forms of dyspnea: Secondary | ICD-10-CM

## 2016-04-15 DIAGNOSIS — I10 Essential (primary) hypertension: Secondary | ICD-10-CM

## 2016-04-15 DIAGNOSIS — Z23 Encounter for immunization: Secondary | ICD-10-CM | POA: Diagnosis not present

## 2016-04-15 NOTE — Progress Notes (Signed)
OFFICE NOTE  Chief Complaint:  No complaints  Primary Care Physician: Gwynneth Aliment, MD  HPI:  Brianna David is a pleasant 83 year old female with a history of hypertension, morbid obesity, and chronic dyspnea. Just as mild dyslipidemia but is not on a statin. I last saw her over year ago and she been having some problems with nausea and vomiting and had had blood work which revealed a marked leukocytosis. This apparently resolved as did her symptoms. Since then she's been doing fairly well. She has a significant sedentary lifestyle and reports not being able to exercise due to bilateral knee pain. For some reason she's managed to lose about 20 pounds, mostly due to decreasing her food intake. She was unaware that she could do upper extremity exercises even though her knees bother her. She currently denies any chest pain.  Brianna David returns today and is doing fairly well. Her blood pressure is a little elevated however she reports being in pain. She is out of her pain medicine and has significant arthritis in her right knee. She recently had an injection in the knee and her hip. She is going to transition her care with Dr. Renae Gloss to another provider in the practice. She is interested in a flu vaccine today.   I saw Brianna David back today in the office. Overall she feels well although does still get short of breath with some exertion. It's been more difficult for her to get around mostly due to her weight and problems with knees. Her husband reports that they're going to try to get a new walker for her today. Weight is now up to 244 from 235. Her shortness of breath is generally stable. Blood pressure is well-controlled today. She did tell me that she had a fall over Christmas. I reviewed her medicines and see that she is on full dose aspirin , presumably for primary prevention as she has no known coronary history.  04/15/2016  Brianna David returns today for follow-up. Over the last  year she's done fairly well. She denies any worsening chest pain or shortness of breath. She has recently lost about 9 pounds since her last office visit. Blood pressure is well-controlled. She continues to have chronic bilateral knee pain but will not undergo surgery per her preference. She is interested in an influenza vaccine today.  PMHx:  Past Medical History:  Diagnosis Date  . Dyspnea   . Hyperlipidemia   . Hypertension   . Morbid obesity (HCC)     Past Surgical History:  Procedure Laterality Date  . CARDIAC CATHETERIZATION  08/07/2008   Normal coronary arteries and LV function, mildly elevated R heart pressures-due to morbid obesity, continued on aggresive risk factor management  . CARDIOVASCULAR STRESS TEST  07/12/2008   Evidence of moderate ischemia seen in Basal Anterior, Mid Anterior, Apical Anterior, and Apical regions. EKG is negative for ischemia.  Marland Kitchen PARTIAL HYSTERECTOMY  1972  . TRANSTHORACIC ECHOCARDIOGRAM  07/12/2008   EF 73%, mild AI, trace MR, PR, and TR    FAMHx:  Family History  Problem Relation Age of Onset  . Cancer Mother   . Heart attack Father   . Heart failure Maternal Grandmother   . Heart failure Maternal Grandfather     SOCHx:   reports that she has quit smoking. She has never used smokeless tobacco. She reports that she does not drink alcohol or use drugs.  ALLERGIES:  Allergies  Allergen Reactions  . Penicillins Other (See Comments)  Unknown childhood allergy    ROS: Pertinent items noted in HPI and remainder of comprehensive ROS otherwise negative.  HOME MEDS: Current Outpatient Prescriptions  Medication Sig Dispense Refill  . aspirin 81 MG tablet Take 1 tablet (81 mg total) by mouth daily. 30 tablet 0  . Cholecalciferol 1000 UNITS capsule Take 1,000 Units by mouth daily.    Marland Kitchen. HYDROcodone-acetaminophen (NORCO/VICODIN) 5-325 MG tablet Take 1 tablet by mouth every 6 (six) hours as needed. 20 tablet 0  . losartan-hydrochlorothiazide  (HYZAAR) 100-25 MG tablet Take 1 tablet by mouth daily.    . potassium chloride (MICRO-K) 10 MEQ CR capsule Take 1 capsule by mouth every morning.     . traMADol-acetaminophen (ULTRACET) 37.5-325 MG per tablet Take 1-2 tablets by mouth every 6 (six) hours as needed for severe pain.      No current facility-administered medications for this visit.     LABS/IMAGING: No results found for this or any previous visit (from the past 48 hour(s)). No results found.  VITALS: BP 128/76   Pulse 84   Ht 5\' 8"  (1.727 m)   Wt 235 lb (106.6 kg)   BMI 35.73 kg/m   EXAM: General appearance: alert and no distress Neck: no carotid bruit and no JVD Lungs: clear to auscultation bilaterally Heart: regular rate and rhythm, S1, S2 normal, no murmur, click, rub or gallop Abdomen: soft, non-tender; bowel sounds normal; no masses,  no organomegaly and morbidly obese Extremities: edema trace bilateral LE edema Pulses: 2+ and symmetric Skin: Skin color, texture, turgor normal. No rashes or lesions Neurologic: Grossly normal Psych: Mood, affect normal  EKG: Normal sinus rhythm at 84  ASSESSMENT: 1. Morbid obesity 2. Hypertension 3. Dyslipidemia-recently assessed by primary care provider 4. Chronic bilateral knee pain 5. Sedentary lifestyle  PLAN: 1.   Ms. Elijah BirkCaldwell has managed to lose about 9 pounds since we saw her last year. Blood pressure is well controlled. Cholesterol is followed by her primary care provider. She Has chronic bilateral knee osteoarthritis and has no plans for surgery. Fortunately she is very sedentary and it makes it difficult for her to lose weight. I recommend continuing current medications we'll see her back annually or sooner as necessary.  Chrystie NoseKenneth C. Hilty, MD, [facility] Attending Cardiologist [facility]  Chrystie NoseKenneth C Hilty 04/15/2016, 3:22 PM

## 2016-04-15 NOTE — Patient Instructions (Signed)
Your physician wants you to follow-up in: ONE YEAR with Dr. Hilty. You will receive a reminder letter in the mail two months in advance. If you don't receive a letter, please call our office to schedule the follow-up appointment.  

## 2016-05-24 DIAGNOSIS — M2550 Pain in unspecified joint: Secondary | ICD-10-CM | POA: Diagnosis not present

## 2016-05-24 DIAGNOSIS — M179 Osteoarthritis of knee, unspecified: Secondary | ICD-10-CM | POA: Diagnosis not present

## 2016-05-24 DIAGNOSIS — I10 Essential (primary) hypertension: Secondary | ICD-10-CM | POA: Diagnosis not present

## 2016-05-24 DIAGNOSIS — M25512 Pain in left shoulder: Secondary | ICD-10-CM | POA: Diagnosis not present

## 2016-06-01 ENCOUNTER — Encounter (HOSPITAL_COMMUNITY): Payer: Self-pay

## 2016-06-01 ENCOUNTER — Inpatient Hospital Stay (HOSPITAL_COMMUNITY)
Admission: EM | Admit: 2016-06-01 | Discharge: 2016-06-03 | DRG: 558 | Disposition: A | Discharge status: Skilled Nursing Facility | Payer: Medicare HMO | Attending: Internal Medicine | Admitting: Internal Medicine

## 2016-06-01 ENCOUNTER — Emergency Department (HOSPITAL_COMMUNITY): Payer: Medicare HMO

## 2016-06-01 DIAGNOSIS — M6282 Rhabdomyolysis: Principal | ICD-10-CM | POA: Diagnosis present

## 2016-06-01 DIAGNOSIS — Z880 Allergy status to penicillin: Secondary | ICD-10-CM | POA: Diagnosis not present

## 2016-06-01 DIAGNOSIS — M1611 Unilateral primary osteoarthritis, right hip: Secondary | ICD-10-CM | POA: Diagnosis not present

## 2016-06-01 DIAGNOSIS — M6281 Muscle weakness (generalized): Secondary | ICD-10-CM | POA: Diagnosis not present

## 2016-06-01 DIAGNOSIS — I10 Essential (primary) hypertension: Secondary | ICD-10-CM | POA: Diagnosis present

## 2016-06-01 DIAGNOSIS — R262 Difficulty in walking, not elsewhere classified: Secondary | ICD-10-CM | POA: Diagnosis not present

## 2016-06-01 DIAGNOSIS — Z79899 Other long term (current) drug therapy: Secondary | ICD-10-CM | POA: Diagnosis not present

## 2016-06-01 DIAGNOSIS — T796XXA Traumatic ischemia of muscle, initial encounter: Secondary | ICD-10-CM | POA: Diagnosis not present

## 2016-06-01 DIAGNOSIS — T1490XA Injury, unspecified, initial encounter: Secondary | ICD-10-CM | POA: Diagnosis not present

## 2016-06-01 DIAGNOSIS — R296 Repeated falls: Secondary | ICD-10-CM | POA: Diagnosis present

## 2016-06-01 DIAGNOSIS — R8290 Unspecified abnormal findings in urine: Secondary | ICD-10-CM | POA: Diagnosis present

## 2016-06-01 DIAGNOSIS — M25551 Pain in right hip: Secondary | ICD-10-CM | POA: Diagnosis not present

## 2016-06-01 DIAGNOSIS — G8929 Other chronic pain: Secondary | ICD-10-CM | POA: Diagnosis not present

## 2016-06-01 DIAGNOSIS — R5381 Other malaise: Secondary | ICD-10-CM | POA: Diagnosis not present

## 2016-06-01 DIAGNOSIS — R319 Hematuria, unspecified: Secondary | ICD-10-CM | POA: Diagnosis present

## 2016-06-01 DIAGNOSIS — E876 Hypokalemia: Secondary | ICD-10-CM | POA: Diagnosis present

## 2016-06-01 DIAGNOSIS — L8990 Pressure ulcer of unspecified site, unspecified stage: Secondary | ICD-10-CM | POA: Insufficient documentation

## 2016-06-01 DIAGNOSIS — F339 Major depressive disorder, recurrent, unspecified: Secondary | ICD-10-CM

## 2016-06-01 DIAGNOSIS — W19XXXA Unspecified fall, initial encounter: Secondary | ICD-10-CM | POA: Diagnosis not present

## 2016-06-01 DIAGNOSIS — L89212 Pressure ulcer of right hip, stage 2: Secondary | ICD-10-CM | POA: Diagnosis present

## 2016-06-01 DIAGNOSIS — I2510 Atherosclerotic heart disease of native coronary artery without angina pectoris: Secondary | ICD-10-CM | POA: Diagnosis present

## 2016-06-01 DIAGNOSIS — R0902 Hypoxemia: Secondary | ICD-10-CM | POA: Diagnosis not present

## 2016-06-01 DIAGNOSIS — Z87891 Personal history of nicotine dependence: Secondary | ICD-10-CM

## 2016-06-01 DIAGNOSIS — E785 Hyperlipidemia, unspecified: Secondary | ICD-10-CM | POA: Diagnosis present

## 2016-06-01 DIAGNOSIS — Z7982 Long term (current) use of aspirin: Secondary | ICD-10-CM | POA: Diagnosis not present

## 2016-06-01 DIAGNOSIS — R531 Weakness: Secondary | ICD-10-CM | POA: Diagnosis not present

## 2016-06-01 DIAGNOSIS — G894 Chronic pain syndrome: Secondary | ICD-10-CM | POA: Diagnosis not present

## 2016-06-01 DIAGNOSIS — Z8249 Family history of ischemic heart disease and other diseases of the circulatory system: Secondary | ICD-10-CM

## 2016-06-01 DIAGNOSIS — R2681 Unsteadiness on feet: Secondary | ICD-10-CM | POA: Diagnosis not present

## 2016-06-01 DIAGNOSIS — R41841 Cognitive communication deficit: Secondary | ICD-10-CM | POA: Diagnosis not present

## 2016-06-01 DIAGNOSIS — N390 Urinary tract infection, site not specified: Secondary | ICD-10-CM | POA: Diagnosis not present

## 2016-06-01 LAB — CBC WITH DIFFERENTIAL/PLATELET
BASOS ABS: 0 10*3/uL (ref 0.0–0.1)
BASOS PCT: 0 %
EOS ABS: 0 10*3/uL (ref 0.0–0.7)
Eosinophils Relative: 0 %
HCT: 42.4 % (ref 36.0–46.0)
Hemoglobin: 14.5 g/dL (ref 12.0–15.0)
Lymphocytes Relative: 7 %
Lymphs Abs: 1.1 10*3/uL (ref 0.7–4.0)
MCH: 32.9 pg (ref 26.0–34.0)
MCHC: 34.2 g/dL (ref 30.0–36.0)
MCV: 96.1 fL (ref 78.0–100.0)
MONO ABS: 1.3 10*3/uL — AB (ref 0.1–1.0)
MONOS PCT: 9 %
NEUTROS ABS: 12.9 10*3/uL — AB (ref 1.7–7.7)
Neutrophils Relative %: 84 %
PLATELETS: 271 10*3/uL (ref 150–400)
RBC: 4.41 MIL/uL (ref 3.87–5.11)
RDW: 13.8 % (ref 11.5–15.5)
WBC: 15.4 10*3/uL — ABNORMAL HIGH (ref 4.0–10.5)

## 2016-06-01 LAB — COMPREHENSIVE METABOLIC PANEL
ALK PHOS: 52 U/L (ref 38–126)
ALT: 32 U/L (ref 14–54)
ANION GAP: 14 (ref 5–15)
AST: 99 U/L — ABNORMAL HIGH (ref 15–41)
Albumin: 3.3 g/dL — ABNORMAL LOW (ref 3.5–5.0)
BILIRUBIN TOTAL: 3.2 mg/dL — AB (ref 0.3–1.2)
BUN: 20 mg/dL (ref 6–20)
CALCIUM: 9.4 mg/dL (ref 8.9–10.3)
CO2: 25 mmol/L (ref 22–32)
CREATININE: 0.87 mg/dL (ref 0.44–1.00)
Chloride: 101 mmol/L (ref 101–111)
GFR calc Af Amer: >60 mL/min (ref >60)
GFR calc non Af Amer: 59 mL/min — ABNORMAL LOW (ref >60)
GLUCOSE: 109 mg/dL — AB (ref 65–99)
Potassium: 3.2 mmol/L — ABNORMAL LOW (ref 3.5–5.1)
Sodium: 140 mmol/L (ref 135–145)
TOTAL PROTEIN: 7.5 g/dL (ref 6.5–8.1)

## 2016-06-01 LAB — URINALYSIS, ROUTINE W REFLEX MICROSCOPIC
GLUCOSE, UA: NEGATIVE mg/dL
Ketones, ur: 20 mg/dL — AB
LEUKOCYTES UA: NEGATIVE
NITRITE: NEGATIVE
PROTEIN: 30 mg/dL — AB
SPECIFIC GRAVITY, URINE: 1.03 (ref 1.005–1.030)
Squamous Epithelial / LPF: NONE SEEN
pH: 5 (ref 5.0–8.0)

## 2016-06-01 LAB — GLUCOSE, CAPILLARY: GLUCOSE-CAPILLARY: 122 mg/dL — AB (ref 65–99)

## 2016-06-01 LAB — CK
Total CK: 2812 U/L — ABNORMAL HIGH (ref 38–234)
Total CK: 2264 U/L — ABNORMAL HIGH (ref 38–234)

## 2016-06-01 LAB — I-STAT TROPONIN, ED: TROPONIN I, POC: 0.03 ng/mL (ref 0.00–0.08)

## 2016-06-01 MED ORDER — NAPROXEN SODIUM 275 MG PO TABS
275.0000 mg | ORAL_TABLET | Freq: Four times a day (QID) | ORAL | Status: DC | PRN
Start: 2016-06-01 — End: 2016-06-03
  Filled 2016-06-01: qty 1

## 2016-06-01 MED ORDER — TRAMADOL-ACETAMINOPHEN 37.5-325 MG PO TABS
1.0000 | ORAL_TABLET | Freq: Four times a day (QID) | ORAL | Status: DC | PRN
  Administered 2016-06-02: 2 via ORAL
  Administered 2016-06-02: 1 via ORAL
  Administered 2016-06-02: 2 via ORAL
  Administered 2016-06-03 (×2): 1 via ORAL
  Administered 2016-06-03: 2 via ORAL
  Filled 2016-06-01: qty 1
  Filled 2016-06-01: qty 2
  Filled 2016-06-01: qty 1
  Filled 2016-06-01 (×2): qty 2
  Filled 2016-06-01: qty 1

## 2016-06-01 MED ORDER — SODIUM CHLORIDE 0.9 % IV SOLN
INTRAVENOUS | Status: DC
  Administered 2016-06-01: 1000 mL via INTRAVENOUS
  Administered 2016-06-02: 05:00:00 via INTRAVENOUS

## 2016-06-01 MED ORDER — DEXTROSE 5 % IV SOLN
1.0000 g | INTRAVENOUS | Status: DC
  Administered 2016-06-01 – 2016-06-02 (×2): 1 g via INTRAVENOUS
  Filled 2016-06-01 (×3): qty 10

## 2016-06-01 MED ORDER — SODIUM CHLORIDE 0.9 % IV BOLUS (SEPSIS)
1000.0000 mL | Freq: Once | INTRAVENOUS | Status: AC
  Administered 2016-06-01: 1000 mL via INTRAVENOUS

## 2016-06-01 MED ORDER — POTASSIUM CHLORIDE CRYS ER 10 MEQ PO TBCR
10.0000 meq | EXTENDED_RELEASE_TABLET | Freq: Every day | ORAL | Status: DC
  Administered 2016-06-01 – 2016-06-03 (×3): 10 meq via ORAL
  Filled 2016-06-01 (×3): qty 1

## 2016-06-01 MED ORDER — HYDRALAZINE HCL 20 MG/ML IJ SOLN: 5.0000 mg | INTRAMUSCULAR | Status: DC | PRN

## 2016-06-01 MED ORDER — HEPARIN SODIUM (PORCINE) 5000 UNIT/ML IJ SOLN
5000.0000 [IU] | Freq: Three times a day (TID) | INTRAMUSCULAR | Status: DC
  Administered 2016-06-01 – 2016-06-03 (×5): 5000 [IU] via SUBCUTANEOUS
  Filled 2016-06-01 (×5): qty 1

## 2016-06-01 MED ORDER — ASPIRIN EC 81 MG PO TBEC
81.0000 mg | DELAYED_RELEASE_TABLET | Freq: Every day | ORAL | Status: DC
  Administered 2016-06-02 – 2016-06-03 (×2): 81 mg via ORAL
  Filled 2016-06-01 (×2): qty 1

## 2016-06-01 MED ORDER — LOSARTAN POTASSIUM 50 MG PO TABS
100.0000 mg | ORAL_TABLET | Freq: Every day | ORAL | Status: DC
  Administered 2016-06-02 – 2016-06-03 (×2): 100 mg via ORAL
  Filled 2016-06-01 (×2): qty 2

## 2016-06-01 MED ORDER — HYDROCHLOROTHIAZIDE 25 MG PO TABS
25.0000 mg | ORAL_TABLET | Freq: Every day | ORAL | Status: DC
  Administered 2016-06-02 – 2016-06-03 (×2): 25 mg via ORAL
  Filled 2016-06-01 (×2): qty 1

## 2016-06-01 MED ORDER — ONDANSETRON HCL 4 MG PO TABS: 4.0000 mg | ORAL_TABLET | Freq: Four times a day (QID) | ORAL | Status: DC | PRN

## 2016-06-01 MED ORDER — LOSARTAN POTASSIUM-HCTZ 100-25 MG PO TABS: 1.0000 | ORAL_TABLET | Freq: Every day | ORAL | Status: DC

## 2016-06-01 MED ORDER — ONDANSETRON HCL 4 MG/2ML IJ SOLN: 4.0000 mg | Freq: Four times a day (QID) | INTRAMUSCULAR | Status: DC | PRN

## 2016-06-01 NOTE — ED Notes (Signed)
EKG given to Dr. Delo 

## 2016-06-01 NOTE — ED Notes (Signed)
Case management at bedside.

## 2016-06-01 NOTE — ED Triage Notes (Signed)
Per EMS - pt from home, family and friends usually check in on pt. Fell at home, on ground for 2 hours. Denies LOC. Right-sided neck, hip, and leg soreness. 2nd fall for pt this week. Pt's was on ground for 3 days after first fall but was not seen in ED. Both falls on right side. Pt a&o x 4. VSS. Pt was on 2L [place] because pulse ox ready SpO2 70s en route to ED.

## 2016-06-01 NOTE — ED Notes (Signed)
Report called on Pt.  Floor Nurse reports bed is not currently in the room for the pt.  Waiting for bed to arrive then pt will be sent up.

## 2016-06-01 NOTE — Progress Notes (Signed)
Patient trasfered from ED to [phone] via stretcher; alert and oriented x 4; no complaints of pain; IV saline locked in RFA. Orient patient to room and unit;gave patient care guide; instructed how to use the call bell and  fall risk precautions. Will continue to monitor the patient.

## 2016-06-01 NOTE — ED Notes (Signed)
Pt transported to xray 

## 2016-06-01 NOTE — H&P (Signed)
History and Physical    Brianna David ZOX:[id number] DOB: 1929/11/14 DOA: 06/01/2016  PCP: Gwynneth Alimentobyn N Sanders, MD Patient coming from: home  Chief Complaint: Hip pain  HPI: Brianna RadarDorothy M Nuzum is a 83 y.o. female with medical history significant of hyperlipidemia, hypertension, obesity presenting to the hospital complaining of right hip pain after multiple falls. History provided in part by patient and patient's brother found the patient down just prior to arrival. Patient reports falling when her walker collapsed. Patient states this was a mechanical failure on the walker as she did not fully lock it fall occurred at 01:00 on 05/30/2016. Patient was unable to get up at that time and remained on the floor until the following morning when a family member found her and helped her up. Patient denies LOC, headache, neck stiffness, dizziness/vertigo, fever, abdominal pain, chest pain, palpitations, nausea, vomiting. Patient fell while trying to ambulate to the bathroom and ended at wetting herself she is unable to hold her urine any longer. Patient denies any dysuria or frequency. Patient fell again tonight of 05/31/2016 at approximately 17:00 where she stayed on the floor throughout the night again. Family found patient on the floor the morning of admission and brought her to the emergency room. Denies any seizure type activity or loss of bowel or bladder function.   ED Course: 1L NS bolus given in ED. Objective findings outlined below.   Review of Systems: As per HPI otherwise 10 point review of systems negative.   Ambulatory Status: deconditioned and requires assistance.   Past Medical History:  Diagnosis Date  . Dyspnea   . Hyperlipidemia   . Hypertension   . Morbid obesity (HCC)     Past Surgical History:  Procedure Laterality Date  . CARDIAC CATHETERIZATION  08/07/2008   Normal coronary arteries and LV function, mildly elevated R heart pressures-due to morbid obesity, continued on  aggresive risk factor management  . CARDIOVASCULAR STRESS TEST  07/12/2008   Evidence of moderate ischemia seen in Basal Anterior, Mid Anterior, Apical Anterior, and Apical regions. EKG is negative for ischemia.  Marland Kitchen. PARTIAL HYSTERECTOMY  1972  . TRANSTHORACIC ECHOCARDIOGRAM  07/12/2008   EF 73%, mild AI, trace MR, PR, and TR    Social History   Social History  . Marital status: Widowed    Spouse name: N/A  . Number of children: N/A  . Years of education: N/A   Occupational History  . Not on file.   Social History Main Topics  . Smoking status: Former Games developermoker  . Smokeless tobacco: Never Used     Comment: age 83, smoked 1 year  . Alcohol use No  . Drug use: No  . Sexual activity: Not on file   Other Topics Concern  . Not on file   Social History Narrative  . No narrative on file    Allergies  Allergen Reactions  . Penicillins Other (See Comments)    Unknown childhood allergy    Family History  Problem Relation Age of Onset  . Cancer Mother   . Heart attack Father   . Heart failure Maternal Grandmother   . Heart failure Maternal Grandfather     Prior to Admission medications   Medication Sig Start Date End Date Taking? Authorizing Provider  aspirin 81 MG tablet Take 1 tablet (81 mg total) by mouth daily. 04/18/15  Yes Chrystie NoseKenneth C Hilty, MD  Cholecalciferol 1000 UNITS capsule Take 1,000 Units by mouth daily.   Yes Historical Provider, MD  HYDROcodone-acetaminophen (  NORCO/VICODIN) 5-325 MG tablet Take 1 tablet by mouth every 6 (six) hours as needed. 01/26/15  Yes Gwyneth Sprout, MD  losartan-hydrochlorothiazide (HYZAAR) 100-25 MG tablet Take 1 tablet by mouth daily. 04/14/15  Yes Historical Provider, MD  Naproxen Sodium (ALEVE) 220 MG CAPS Take 220 mg by mouth every 6 (six) hours.   Yes Historical Provider, MD  potassium chloride (MICRO-K) 10 MEQ CR capsule Take 1 capsule by mouth every morning.  12/12/12  Yes Historical Provider, MD  traMADol-acetaminophen (ULTRACET) 37.5-325  MG per tablet Take 1-2 tablets by mouth every 6 (six) hours as needed for severe pain.     Historical Provider, MD    Physical Exam: Vitals:   06/01/16 1415 06/01/16 1445 06/01/16 1615 06/01/16 1630  BP: 125/94 130/67 139/67 133/62  Pulse: 81 81 81 92  Resp: 25 25 24 22   Temp:      TempSrc:      SpO2: 100% 100% 99% 100%     General:  Appears calm and comfortable Eyes:  PERRL, EOMI, normal lids, iris ENT: dry mm grossly normal hearing,  Neck:  no LAD, masses or thyromegaly Cardiovascular:  RRR, no m/r/g. No LE edema.  Respiratory:  CTA bilaterally, no w/r/r. Normal respiratory effort. Abdomen:  soft, ntnd, NABS Skin:  no rash or induration seen on limited exam Musculoskeletal:  Grossly decreased tone BUE/BLE, good ROM, no bony abnormality Psychiatric:  grossly normal mood and affect, speech fluent and appropriate, AOx3 Neurologic:  CN 2-12 grossly intact, moves all extremities in coordinated fashion, sensation intact  Labs on Admission: I have personally reviewed following labs and imaging studies  CBC:  Recent Labs Lab 06/01/16 1031  WBC 15.4*  NEUTROABS 12.9*  HGB 14.5  HCT 42.4  MCV 96.1  PLT 271   Basic Metabolic Panel:  Recent Labs Lab 06/01/16 1031  NA 140  K 3.2*  CL 101  CO2 25  GLUCOSE 109*  BUN 20  CREATININE 0.87  CALCIUM 9.4   GFR: CrCl cannot be calculated (Unknown ideal weight.). Liver Function Tests:  Recent Labs Lab 06/01/16 1031  AST 99*  ALT 32  ALKPHOS 52  BILITOT 3.2*  PROT 7.5  ALBUMIN 3.3*   No results for input(s): LIPASE, AMYLASE in the last 168 hours. No results for input(s): AMMONIA in the last 168 hours. Coagulation Profile: No results for input(s): INR, PROTIME in the last 168 hours. Cardiac Enzymes:  Recent Labs Lab 06/01/16 1031  CKTOTAL 2,812*   BNP (last 3 results) No results for input(s): PROBNP in the last 8760 hours. HbA1C: No results for input(s): HGBA1C in the last 72 hours. CBG: No results for  input(s): GLUCAP in the last 168 hours. Lipid Profile: No results for input(s): CHOL, HDL, LDLCALC, TRIG, CHOLHDL, LDLDIRECT in the last 72 hours. Thyroid Function Tests: No results for input(s): TSH, T4TOTAL, FREET4, T3FREE, THYROIDAB in the last 72 hours. Anemia Panel: No results for input(s): VITAMINB12, FOLATE, FERRITIN, TIBC, IRON, RETICCTPCT in the last 72 hours. Urine analysis:    Component Value Date/Time   COLORURINE AMBER (A) 06/01/2016 1049   APPEARANCEUR HAZY (A) 06/01/2016 1049   LABSPEC 1.030 06/01/2016 1049   PHURINE 5.0 06/01/2016 1049   GLUCOSEU NEGATIVE 06/01/2016 1049   HGBUR MODERATE (A) 06/01/2016 1049   BILIRUBINUR SMALL (A) 06/01/2016 1049   KETONESUR 20 (A) 06/01/2016 1049   PROTEINUR 30 (A) 06/01/2016 1049   NITRITE NEGATIVE 06/01/2016 1049   LEUKOCYTESUR NEGATIVE 06/01/2016 1049    Creatinine Clearance: CrCl cannot  be calculated (Unknown ideal weight.).  Sepsis Labs: @LABRCNTIP (procalcitonin:4,lacticidven:4) )No results found for this or any previous visit (from the past 240 hour(s)).   Radiological Exams on Admission: Dg Chest 1 View  Result Date: 06/01/2016 CLINICAL DATA:  Multiple recent falls.  Weakness. EXAM: CHEST 1 VIEW COMPARISON:  08/02/2008 FINDINGS: Heart size and pulmonary vascularity are normal and the lungs are clear. Severe arthritis of the left shoulder joint with mild arthritis of the right shoulder joint. IMPRESSION: No acute abnormalities. Electronically Signed   By: Francene Boyers M.D.   On: 06/01/2016 11:27   Dg Hip Unilat W Or Wo Pelvis 2-3 Views Right  Result Date: 06/01/2016 CLINICAL DATA:  Right hip pain. Multiple recent falls, the most recent 5 days ago. EXAM: DG HIP (WITH OR WITHOUT PELVIS) 2-3V RIGHT COMPARISON:  Radiographs dated 01/26/2015 FINDINGS: The patient has severe osteoarthritis of the right hip with erosion of the superior aspect of the right acetabulum with prominent marginal osteophytes on the acetabulum and  femoral head with erosions of the acetabulum and femoral head. There are less severe osteoarthritic changes of the left hip. There is no fracture or dislocation. IMPRESSION: No acute bone abnormality. Progressive severe osteoarthritis of the right hip. Electronically Signed   By: Francene Boyers M.D.   On: 06/01/2016 11:26    EKG: Independently reviewed. Sinus no ACS  Assessment/Plan Active Problems:   HTN (hypertension)   Rhabdomyolysis   Falls   Hematuria   Chronic pain   Rhabdomyolysis: CK 2812. 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 Cr 0.87.  - NS 173ml/hr - CK Q8 - BMET in am  Multiple falls: pt lives by self. Mechanical falls from not using walker properly but pt unable to get up off floor. Likely from age related deconditioning w/ possible UTI. Case mgt consulted by EDP for assistance w/ possible placement vs HH. - PT/OT/Nutrition - Social work consult for SNF placement - insurance does not require 3 day hospital stay. Pt amenable to placement.  - +/- CIR consult  Abnormal Urine: concern for UTI given deconditioning and falls - Ceftriaxone - UCX - DC abx if UCX unremarkable  HTN: - continue Hyzaar  Non-obstructive CAD:  - continue ASA  Chronic pain: - continue Ultracet, Aleve  DVT prophylaxis: lovenox  Code Status: full  Family Communication: brother  Disposition Plan: pending imrpovement and possible placement  Consults called: social work, case mgt  Admission status: observation    MERRELL, DAVID J MD [facility]  If 7PM-7AM, please contact night-coverage [email or web address] Password [facility]  06/01/2016, 4:58 PM

## 2016-06-01 NOTE — ED Provider Notes (Signed)
[facility]-EMERGENCY DEPT Provider Note   CSN: [id number] Arrival date & time: 06/01/16  1004     History   Chief Complaint Chief Complaint  Patient presents with  . Fall    HPI Lawson RadarDorothy M Heppler is a 83 y.o. female.  Patient is an 83 year old female with history of hypertension, arthritis, and obesity. She presents for evaluation after a fall. She was apparently found on the floor this morning by her brother who came to check on her this morning. The patient reports she has arthritis in her knee and hip and this is what caused her to lose her Allen's and fall. She denies having struck her head or any loss of consciousness. She denies any neck pain, difficulty breathing, or abdominal pain.  In speaking with the patient's brother, he is informed me that she has become increasingly less steady on her feet and is becoming unable to care for herself. He tells me she fell earlier this week and possibly spent 3 days on the floor prior to being helped to her feet. She did not seek medical attention after this previous fall.   The history is provided by the patient and a relative.  Fall  This is a new problem. The current episode started 1 to 2 hours ago. The problem occurs constantly. The problem has not changed since onset.Pertinent negatives include no chest pain, no abdominal pain, no headaches and no shortness of breath. Nothing aggravates the symptoms. Nothing relieves the symptoms. She has tried nothing for the symptoms.    Past Medical History:  Diagnosis Date  . Dyspnea   . Hyperlipidemia   . Hypertension   . Morbid obesity [facility](HCC)     Patient Active Problem List   Diagnosis Date Noted  . Encounter for immunization 04/15/2016  . HTN (hypertension) 02/20/2013  . Morbid obesity (HCC) 02/20/2013  . DOE (dyspnea on exertion) 02/20/2013    Past Surgical History:  Procedure Laterality Date  . CARDIAC CATHETERIZATION  08/07/2008   Normal coronary arteries and LV function, mildly  elevated R heart pressures-due to morbid obesity, continued on aggresive risk factor management  . CARDIOVASCULAR STRESS TEST  07/12/2008   Evidence of moderate ischemia seen in Basal Anterior, Mid Anterior, Apical Anterior, and Apical regions. EKG is negative for ischemia.  Marland Kitchen. PARTIAL HYSTERECTOMY  1972  . TRANSTHORACIC ECHOCARDIOGRAM  07/12/2008   EF 73%, mild AI, trace MR, PR, and TR    OB History    No data available       Home Medications    Prior to Admission medications   Medication Sig Start Date End Date Taking? Authorizing Provider  aspirin 81 MG tablet Take 1 tablet (81 mg total) by mouth daily. 04/18/15   Chrystie NoseKenneth C Hilty, MD  Cholecalciferol 1000 UNITS capsule Take 1,000 Units by mouth daily.    Historical Provider, MD  HYDROcodone-acetaminophen (NORCO/VICODIN) 5-325 MG tablet Take 1 tablet by mouth every 6 (six) hours as needed. 01/26/15   Gwyneth SproutWhitney Plunkett, MD  losartan-hydrochlorothiazide (HYZAAR) 100-25 MG tablet Take 1 tablet by mouth daily. 04/14/15   Historical Provider, MD  potassium chloride (MICRO-K) 10 MEQ CR capsule Take 1 capsule by mouth every morning.  12/12/12   Historical Provider, MD  traMADol-acetaminophen (ULTRACET) 37.5-325 MG per tablet Take 1-2 tablets by mouth every 6 (six) hours as needed for severe pain.     Historical Provider, MD    Family History Family History  Problem Relation Age of Onset  . Cancer Mother   .  Heart attack Father   . Heart failure Maternal Grandmother   . Heart failure Maternal Grandfather     Social History Social History  Substance Use Topics  . Smoking status: Former Games developer  . Smokeless tobacco: Never Used     Comment: age 52, smoked 1 year  . Alcohol use No     Allergies   Penicillins   Review of Systems Review of Systems  Respiratory: Negative for shortness of breath.   Cardiovascular: Negative for chest pain.  Gastrointestinal: Negative for abdominal pain.  Neurological: Negative for headaches.  All other  systems reviewed and are negative.    Physical Exam Updated Vital Signs There were no vitals taken for this visit.  Physical Exam  Constitutional: She is oriented to person, place, and time. No distress.  Patient is an 83 year old female in no acute distress. She is awake, alert, and appears oriented, however is somewhat unkempt.  HENT:  Head: Normocephalic and atraumatic.  Neck: Normal range of motion. Neck supple.  Cardiovascular: Normal rate and regular rhythm.  Exam reveals no gallop and no friction rub.   No murmur heard. Pulmonary/Chest: Effort normal and breath sounds normal. No respiratory distress. She has no wheezes.  Abdominal: Soft. Bowel sounds are normal. She exhibits no distension. There is no tenderness.  Musculoskeletal: Normal range of motion.  There is no obvious deformity of the right hip. She does have good range of motion without significant discomfort. Distal PMS is intact to both lower extremities.  Neurological: She is alert and oriented to person, place, and time.  Skin: Skin is warm and dry. She is not diaphoretic.  There is skin breakdown to the intertrigo of the lower abdomen. There is also some erythema and skin breakdown to the skin overlying the right hip.  Nursing note and vitals reviewed.    ED Treatments / Results  Labs (all labs ordered are listed, but only abnormal results are displayed) Labs Reviewed  COMPREHENSIVE METABOLIC PANEL  CBC WITH DIFFERENTIAL/PLATELET  URINALYSIS, ROUTINE W REFLEX MICROSCOPIC  CK  I-STAT TROPOININ, ED    EKG  EKG Interpretation None       Radiology No results found.  Procedures Procedures (including critical care time)  Medications Ordered in ED Medications  sodium chloride 0.9 % bolus 1,000 mL (not administered)     Initial Impression / Assessment and Plan / ED Course  I have reviewed the triage vital signs and the nursing notes.  Pertinent labs & imaging results that were available during  my care of the patient were reviewed by me and considered in my medical decision making (see chart for details).  Patient is an 83 year old female who has fallen twice this week. She was found on the floor this morning by her brother. According to him, she has had a progressive decline in function for many months and is in the process of arranging skilled nursing. The patient denies to me she is experiencing any injury from the fall and physical examination is basically unremarkable. She does appear somewhat unkempt.  Her workup reveals no obvious cause for her progressive decline, however does show an elevated CK level consistent with rhabdomyolysis. I've discussed this with Dr. Konrad Dolores from the hospitalist service who agrees to admit for hydration and observation of her renal function.  I've also consulted case management to assist her brother with making arrangements for skilled nursing.  Final Clinical Impressions(s) / ED Diagnoses   Final diagnoses:  None    New Prescriptions New  Prescriptions   No medications on file     Geoffery Lyons, MD 06/02/16 [phone]

## 2016-06-01 NOTE — Care Management Note (Signed)
Case Management Note  Patient Details  Name: Brianna RadarDorothy M Cleverly MRN: [id number] Date of Birth: [birth year]  Subjective/Objective:                  Per EMS - pt from home, family and friends occasionally check on pt. Fell at home, on ground for 2 hours.   Action/Plan: Admit status OBSERVATION (Rhabdomyolysis); anticipate discharge SNF.   Expected Discharge Date:   (unsure)               Expected Discharge Plan:  Skilled Nursing Facility  In-House Referral:  Clinical Social Work  Discharge planning Services  CM Consult  Post Acute Care Choice:    Choice offered to:     DME Arranged:    DME Agency:     HH Arranged:    HH Agency:     Status of Service:  In process, will continue to follow  If discussed at Long Length of Stay Meetings, dates discussed:    Additional Comments:  Oletta CohnWood, Abbiegail Landgren, RN 06/01/2016, 3:12 PM

## 2016-06-02 DIAGNOSIS — R296 Repeated falls: Secondary | ICD-10-CM | POA: Diagnosis present

## 2016-06-02 DIAGNOSIS — G8929 Other chronic pain: Secondary | ICD-10-CM | POA: Diagnosis present

## 2016-06-02 DIAGNOSIS — M6282 Rhabdomyolysis: Secondary | ICD-10-CM | POA: Diagnosis present

## 2016-06-02 DIAGNOSIS — T796XXA Traumatic ischemia of muscle, initial encounter: Secondary | ICD-10-CM | POA: Diagnosis not present

## 2016-06-02 DIAGNOSIS — N390 Urinary tract infection, site not specified: Secondary | ICD-10-CM | POA: Diagnosis not present

## 2016-06-02 DIAGNOSIS — M1611 Unilateral primary osteoarthritis, right hip: Secondary | ICD-10-CM | POA: Diagnosis present

## 2016-06-02 DIAGNOSIS — E876 Hypokalemia: Secondary | ICD-10-CM | POA: Diagnosis present

## 2016-06-02 DIAGNOSIS — R5381 Other malaise: Secondary | ICD-10-CM | POA: Diagnosis not present

## 2016-06-02 DIAGNOSIS — E785 Hyperlipidemia, unspecified: Secondary | ICD-10-CM | POA: Diagnosis present

## 2016-06-02 DIAGNOSIS — R319 Hematuria, unspecified: Secondary | ICD-10-CM | POA: Diagnosis present

## 2016-06-02 DIAGNOSIS — Z8249 Family history of ischemic heart disease and other diseases of the circulatory system: Secondary | ICD-10-CM | POA: Diagnosis not present

## 2016-06-02 DIAGNOSIS — Z87891 Personal history of nicotine dependence: Secondary | ICD-10-CM | POA: Diagnosis not present

## 2016-06-02 DIAGNOSIS — Z7982 Long term (current) use of aspirin: Secondary | ICD-10-CM | POA: Diagnosis not present

## 2016-06-02 DIAGNOSIS — W19XXXA Unspecified fall, initial encounter: Secondary | ICD-10-CM | POA: Diagnosis present

## 2016-06-02 DIAGNOSIS — I2510 Atherosclerotic heart disease of native coronary artery without angina pectoris: Secondary | ICD-10-CM | POA: Diagnosis present

## 2016-06-02 DIAGNOSIS — Z880 Allergy status to penicillin: Secondary | ICD-10-CM | POA: Diagnosis not present

## 2016-06-02 DIAGNOSIS — I10 Essential (primary) hypertension: Secondary | ICD-10-CM | POA: Diagnosis present

## 2016-06-02 DIAGNOSIS — L8990 Pressure ulcer of unspecified site, unspecified stage: Secondary | ICD-10-CM | POA: Insufficient documentation

## 2016-06-02 DIAGNOSIS — Z79899 Other long term (current) drug therapy: Secondary | ICD-10-CM | POA: Diagnosis not present

## 2016-06-02 DIAGNOSIS — L89212 Pressure ulcer of right hip, stage 2: Secondary | ICD-10-CM | POA: Diagnosis present

## 2016-06-02 DIAGNOSIS — R8290 Unspecified abnormal findings in urine: Secondary | ICD-10-CM | POA: Diagnosis present

## 2016-06-02 LAB — CBC
HCT: 36.1 % (ref 36.0–46.0)
Hemoglobin: 12.1 g/dL (ref 12.0–15.0)
MCH: 32.4 pg (ref 26.0–34.0)
MCHC: 33.5 g/dL (ref 30.0–36.0)
MCV: 96.5 fL (ref 78.0–100.0)
Platelets: 236 10*3/uL (ref 150–400)
RBC: 3.74 MIL/uL — ABNORMAL LOW (ref 3.87–5.11)
RDW: 14.1 % (ref 11.5–15.5)
WBC: 11.8 10*3/uL — ABNORMAL HIGH (ref 4.0–10.5)

## 2016-06-02 LAB — BASIC METABOLIC PANEL
Anion gap: 7 (ref 5–15)
BUN: 20 mg/dL (ref 6–20)
CALCIUM: 8.4 mg/dL — AB (ref 8.9–10.3)
CO2: 27 mmol/L (ref 22–32)
CREATININE: 0.76 mg/dL (ref 0.44–1.00)
Chloride: 106 mmol/L (ref 101–111)
GFR calc non Af Amer: >60 mL/min (ref >60)
Glucose, Bld: 114 mg/dL — ABNORMAL HIGH (ref 65–99)
Potassium: 3.1 mmol/L — ABNORMAL LOW (ref 3.5–5.1)
Sodium: 140 mmol/L (ref 135–145)

## 2016-06-02 LAB — CK: CK TOTAL: 1690 U/L — AB (ref 38–234)

## 2016-06-02 MED ORDER — ADULT MULTIVITAMIN W/MINERALS CH
1.0000 | ORAL_TABLET | Freq: Every day | ORAL | Status: DC
  Administered 2016-06-02 – 2016-06-03 (×2): 1 via ORAL
  Filled 2016-06-02 (×2): qty 1

## 2016-06-02 MED ORDER — ZINC OXIDE 12.8 % EX OINT
TOPICAL_OINTMENT | CUTANEOUS | Status: DC | PRN
  Administered 2016-06-02: 11:00:00 via TOPICAL
  Filled 2016-06-02: qty 56.7

## 2016-06-02 MED ORDER — POTASSIUM CHLORIDE CRYS ER 20 MEQ PO TBCR
40.0000 meq | EXTENDED_RELEASE_TABLET | Freq: Once | ORAL | Status: AC
  Administered 2016-06-02: 40 meq via ORAL
  Filled 2016-06-02: qty 2

## 2016-06-02 NOTE — Consult Note (Signed)
   [facility]   06/02/2016  Lawson RadarDorothy M Liuzzi 1929/07/06 [id number]   [facility] Chart reviewed. Noted that current discharge plan is for SNF. Spoke with inpatient RNCM to confirm. [facility]. If disposition plans change to home please make aware.   Raiford NobleAtika Hall, MSN-Ed, RN,BSN [facility] Care Management Hospital Liaison [phone]

## 2016-06-02 NOTE — Progress Notes (Signed)
PROGRESS NOTE    Brianna RadarDorothy M Tomeo  EAV:[id number] DOB: 1930/01/07 DOA: 06/01/2016 PCP: Gwynneth Alimentobyn N Sanders, MD   Outpatient Specialists:     Brief Narrative:  Brianna David is a 83 y.o. female with medical history significant of hyperlipidemia, hypertension, obesity presenting to the hospital complaining of right hip pain after multiple falls. History provided in part by patient and patient's brother found the patient down just prior to arrival. Patient reports falling when her walker collapsed. Patient states this was a mechanical failure on the walker as she did not fully lock it fall occurred at 01:00 on 05/30/2016. Patient was unable to get up at that time and remained on the floor until the following morning when a family member found her and helped her up. Patient denies LOC, headache, neck stiffness, dizziness/vertigo, fever, abdominal pain, chest pain, palpitations, nausea, vomiting. Patient fell while trying to ambulate to the bathroom and ended at wetting herself she is unable to hold her urine any longer. Patient denies any dysuria or frequency. Patient fell again tonight of 05/31/2016 at approximately 17:00 where she stayed on the floor throughout the night again. Family found patient on the floor the morning of admission and brought her to the emergency room. Denies any seizure type activity or loss of bowel or bladder function.   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 Cr 0.87.  - NS 17100ml/hr - repeat CK in AM along with BMP  Multiple falls: pt lives by self. Mechanical falls from not using walker properly but pt unable to get up off floor. Likely from age related deconditioning  -SNF recommended by PT  Abnormal Urine: concern  for UTI given deconditioning and falls - Ceftriaxone x 3 doses - urine culture never sent  HTN: - continue Hyzaar  Non-obstructive CAD:  - continue ASA  Chronic pain: - continue Ultracet, Aleve  Hypokalemia -replete   DVT prophylaxis:  SQ Heparin  Code Status: Full Code   Family Communication: patient  Disposition Plan:  SNF in AM   Consultants:        Subjective: Pain is better and patient feeling better Agreeable to SNF  Objective: Vitals:   06/01/16 1700 06/01/16 1839 06/01/16 2247 06/02/16 0603  BP: (!) 106/50 (!) 125/57 (!) 121/47 (!) 122/54  Pulse: 96 90 84 78  Resp: 25 20 18 18   Temp:  98 F (36.7 C) 98.9 F (37.2 C) 98.2 F (36.8 C)  TempSrc:  Oral Oral Oral  SpO2: 100% 100% 98% 98%  Weight:  95.8 kg (211 lb 4.8 oz)    Height:  5\' 8"  (1.727 m)      Intake/Output Summary (Last 24 hours) at 06/02/16 1047 Last data filed at 06/02/16 0615  Gross per 24 hour  Intake          2248.33 ml  Output                0 ml  Net          2248.33 ml   Filed Weights   06/01/16 1839  Weight: 95.8 kg (211 lb 4.8 oz)    Examination:  General exam: Appears calm and comfortable  Respiratory system: Clear to auscultation. Respiratory effort normal. Cardiovascular  system: S1 & S2 heard, RRR. No JVD, murmurs, rubs, gallops or clicks. No pedal edema. Gastrointestinal system: Abdomen is nondistended, soft and nontender. No organomegaly or masses felt. Normal bowel sounds heard. Central nervous system: Alert and oriented. No focal neurological deficits.      Data Reviewed: I have personally reviewed following labs and imaging studies  CBC:  Recent Labs Lab 06/01/16 1031 06/02/16 0134  WBC 15.4* 11.8*  NEUTROABS 12.9*  --   HGB 14.5 12.1  HCT 42.4 36.1  MCV 96.1 96.5  PLT 271 236   Basic Metabolic Panel:  Recent Labs Lab 06/01/16 1031 06/02/16 0134  NA 140 140  K 3.2* 3.1*  CL 101 106  CO2 25 27  GLUCOSE 109* 114*  BUN 20 20    CREATININE 0.87 0.76  CALCIUM 9.4 8.4*   GFR: Estimated Creatinine Clearance: 62.3 mL/min (by C-G formula based on SCr of 0.76 mg/dL). Liver Function Tests:  Recent Labs Lab 06/01/16 1031  AST 99*  ALT 32  ALKPHOS 52  BILITOT 3.2*  PROT 7.5  ALBUMIN 3.3*   No results for input(s): LIPASE, AMYLASE in the last 168 hours. No results for input(s): AMMONIA in the last 168 hours. Coagulation Profile: No results for input(s): INR, PROTIME in the last 168 hours. Cardiac Enzymes:  Recent Labs Lab 06/01/16 1031 06/01/16 1807 06/02/16 0134  CKTOTAL 2,812* 2,264* 1,690*   BNP (last 3 results) No results for input(s): PROBNP in the last 8760 hours. HbA1C: No results for input(s): HGBA1C in the last 72 hours. CBG:  Recent Labs Lab 06/01/16 2245  GLUCAP 122*   Lipid Profile: No results for input(s): CHOL, HDL, LDLCALC, TRIG, CHOLHDL, LDLDIRECT in the last 72 hours. Thyroid Function Tests: No results for input(s): TSH, T4TOTAL, FREET4, T3FREE, THYROIDAB in the last 72 hours. Anemia Panel: No results for input(s): VITAMINB12, FOLATE, FERRITIN, TIBC, IRON, RETICCTPCT in the last 72 hours. Urine analysis:    Component Value Date/Time   COLORURINE AMBER (A) 06/01/2016 1049   APPEARANCEUR HAZY (A) 06/01/2016 1049   LABSPEC 1.030 06/01/2016 1049   PHURINE 5.0 06/01/2016 1049   GLUCOSEU NEGATIVE 06/01/2016 1049   HGBUR MODERATE (A) 06/01/2016 1049   BILIRUBINUR SMALL (A) 06/01/2016 1049   KETONESUR 20 (A) 06/01/2016 1049   PROTEINUR 30 (A) 06/01/2016 1049   NITRITE NEGATIVE 06/01/2016 1049   LEUKOCYTESUR NEGATIVE 06/01/2016 1049     )No results found for this or any previous visit (from the past 240 hour(s)).    Anti-infectives    Start     Dose/Rate Route Frequency Ordered Stop   06/01/16 1700  cefTRIAXone (ROCEPHIN) 1 g in dextrose 5 % 50 mL IVPB     1 g 100 mL/hr over 30 Minutes Intravenous Every 24 hours 06/01/16 1657         Radiology Studies: Dg Chest 1  View  Result Date: 06/01/2016 CLINICAL DATA:  Multiple recent falls.  Weakness. EXAM: CHEST 1 VIEW COMPARISON:  08/02/2008 FINDINGS: Heart size and pulmonary vascularity are normal and the lungs are clear. Severe arthritis of the left shoulder joint with mild arthritis of the right shoulder joint. IMPRESSION: No acute abnormalities. Electronically Signed   By: Francene Boyers M.D.   On: 06/01/2016 11:27   Dg Hip Unilat W Or Wo Pelvis 2-3 Views Right  Result Date: 06/01/2016 CLINICAL DATA:  Right hip pain. Multiple recent falls, the most recent 5 days ago. EXAM: DG HIP (WITH OR WITHOUT PELVIS) 2-3V RIGHT COMPARISON:  Radiographs  dated 01/26/2015 FINDINGS: The patient has severe osteoarthritis of the right hip with erosion of the superior aspect of the right acetabulum with prominent marginal osteophytes on the acetabulum and femoral head with erosions of the acetabulum and femoral head. There are less severe osteoarthritic changes of the left hip. There is no fracture or dislocation. IMPRESSION: No acute bone abnormality. Progressive severe osteoarthritis of the right hip. Electronically Signed   By: Francene Boyers M.D.   On: 06/01/2016 11:26        Scheduled Meds: . aspirin EC  81 mg Oral Daily  . cefTRIAXone (ROCEPHIN)  IV  1 g Intravenous Q24H  . heparin  5,000 Units Subcutaneous Q8H  . losartan  100 mg Oral Daily   And  . hydrochlorothiazide  25 mg Oral Daily  . potassium chloride  10 mEq Oral Daily   Continuous Infusions: . sodium chloride 100 mL/hr at 06/02/16 0458     LOS: 0 days    Time spent: 25 min    Khali Albanese U Lucill Mauck, DO [facility] Pager [phone]  If 7PM-7AM, please contact night-coverage [email or web address] Password [facility] 06/02/2016, 10:47 AM

## 2016-06-02 NOTE — Plan of Care (Signed)
Problem: Skin Integrity: Goal: Risk for impaired skin integrity will decrease Outcome: Progressing InterDry Ag+ dressings applied under breasts and to R abdominal fold where skin has moisture associated skin breakdown, areas are red and escoriated. Foam dsg applied to R elbow abrasion/scab area that pt obtained from fall at home, foam dsg applied to keep pt from rubbing area on other surfaces.

## 2016-06-02 NOTE — Consult Note (Addendum)
WOC Nurse wound consult note Reason for Consult:MASD Patient with a fall at home where she laid down for 2 hours and just before this she reports she fell and was down for 3 days, was incontinent of bowel and bladder at that time as well. Wound type: MASD with superficial skin peeling inner thighs, perineal area, buttocks ITD intertriginous skin damage from moisture under the pannus and inframammary  Stage 2 Pressure Injury: right trochanter  Pressure Injury POA: Yes Measurement: Right trochanter: 3cm x 3cm x 0.1cm  Wound bed: Superficial skin peeling under the skin folds and inner thighs Right trochanter: 100% pink, moist, partial thickness Drainage (amount, consistency, odor) none Periwound: intact  Dressing procedure/placement/frequency: Add antimicrobial wicking fabric under the skin folds of the breast and pannus, see specific orders. Soft silicone foam to the right trochanter, change every 3 days and PRN soilage, with routine skin assessment under the dressing each shift. Triple paste barrier cream to the inner thighs and buttocks daily and PRN after each episode of incontinence.   Discussed POC with patient and bedside nurse.  Re consult if needed, will not follow at this time. Thanks  Ellyana Crigler M.D.[employer] MSN, RN,CWOCN, CNS [phone])

## 2016-06-02 NOTE — Clinical Social Work Note (Signed)
Clinical Social Work Assessment  Patient Details  Name: Brianna David MRN: [id number] Date of Birth: [birth year]  Date of referral:  06/02/16               Reason for consult:  Facility Placement                Permission sought to share information with:  Facility Medical sales representativeContact Representative, Family Supports Permission granted to share information::  Yes, Verbal Permission Granted  Name::     Scientific laboratory technicianobert  Agency::  SNFs  Relationship::  Brother, POA  Contact Information:  [phone]  Housing/Transportation Living arrangements for the past 2 months:  Apartment Source of Information:  Patient, Chief Technology Officerower of Attorney Patient Interpreter Needed:  None Criminal Activity/Legal Involvement Pertinent to Current Situation/Hospitalization:  No - Comment as needed Significant Relationships:  Siblings Lives with:  Self Do you feel safe going back to the place where you live?  No Need for family participation in patient care:  Yes (Comment)  Care giving concerns:  CSW received consult for possible SNF placement at time of discharge. CSW spoke with patient regarding PT recommendation of SNF placement at time of discharge. Patient lives alone and asked CSW to speak with her brother regarding discharge plans. CSW spoke with patient's brother and POA. He reports that he would like for patient to go to SNF. Patient expressed understanding of PT recommendation and is agreeable to SNF placement at time of discharge. CSW to continue to follow and assist with discharge planning needs.   Social Worker assessment / plan:  CSW spoke with patient and patient's brother [facility]  Employment status:  Retired Database administratornsurance information:  Managed Medicare PT Recommendations:  Skilled Nursing Facility Information / Referral to community resources:  Skilled Nursing Facility  Patient/Family's Response to care:  Patient recognizes need for rehab before returning home and [place]  [place] CSW faxed SNF list to patient's brother.   Patient/Family's Understanding of and Emotional Response to Diagnosis, Current Treatment, and Prognosis:  Patient/family is realistic regarding therapy needs and expressed being hopeful for SNF placement. Patient expressed understanding of CSW role and discharge process. No questions/concerns about plan or treatment.    Emotional Assessment Appearance:  Appears stated age Attitude/Demeanor/Rapport:  Other (Appropriate) Affect (typically observed):  Accepting, Appropriate Orientation:  Oriented to Self, Oriented to Place, Oriented to Situation, Oriented to  Time Alcohol / Substance use:  Not Applicable Psych involvement (Current and /or in the community):  No (Comment)  Discharge Needs  Concerns to be addressed:  Care Coordination Readmission within the last 30 days:  No Current discharge risk:  Lives alone Barriers to Discharge:  Continued Medical Work up   [employer] S Shahil Speegle, LCSWA 06/02/2016, 11:14 AM

## 2016-06-02 NOTE — Evaluation (Signed)
Occupational Therapy Evaluation Patient Details Name: Brianna David MRN: [id number] DOB: 12-19-1929 Today's Date: 06/02/2016    History of Present Illness 83 y.o. female with medical history significant of hyperlipidemia, hypertension, obesity presenting to the hospital complaining of right hip pain after multiple falls.   Clinical Impression   Pt reports she was managing BADL at mod I level PTA. Currently pt min assist for stand pivot transfer, min assist for seated UB ADL, and max assist for LB ADL. Pt presenting with generalized weakness, hx of falls, deconditioning, decreased activity tolerance, and impaired balance impacting her independence and safety with ADL and functional mobility. Recommending SNF for follow up to maximize independence and safety with ADL and functional mobility prior to returning home alone. Pt would benefit from continued skilled OT to address established goals.    Follow Up Recommendations  SNF;Supervision/Assistance - 24 hour    Equipment Recommendations  Other (comment) (TBD at next venue)    Recommendations for Other Services PT consult     Precautions / Restrictions Precautions Precautions: Fall Restrictions Weight Bearing Restrictions: No      Mobility Bed Mobility Overal bed mobility: Needs Assistance Bed Mobility: Supine to Sit     Supine to sit: Min assist;HOB elevated     General bed mobility comments: Min assist at trunk for elevation to sitting. HOB elevated with use of bed rails and increased time required  Transfers Overall transfer level: Needs assistance Equipment used: Rolling walker (2 wheeled) Transfers: Sit to/from [employer] Sit to Stand: Min assist Stand pivot transfers: Min assist       General transfer comment: Cues for hand placement and technique    Balance Overall balance assessment: Needs assistance Sitting-balance support: Feet supported Sitting balance-Leahy Scale: Fair      Standing balance support: Bilateral upper extremity supported Standing balance-Leahy Scale: Poor Standing balance comment: RW for support                            ADL Overall ADL's : Needs assistance/impaired Eating/Feeding: Set up;Sitting   Grooming: Min guard;Sitting   Upper Body Bathing: Minimal assistance;Sitting   Lower Body Bathing: Maximal assistance;Sit to/from stand   Upper Body Dressing : Minimal assistance;Sitting   Lower Body Dressing: Maximal assistance;Sit to/from stand   Toilet Transfer: Minimal assistance;Stand-pivot;BSC;RW Statistician Details (indicate cue type and reason): Simulated by stand pivot from EOB to chair         Functional mobility during ADLs: Minimal assistance;Rolling walker General ADL Comments: Pt with SOB and lightheadedness sitting EOB; SpO2 94-96% on RA. Pt reports she feel too weak to perfrom functional mobility at this time; agreeable to stand pivot to chair.     Vision         Perception     Praxis      Pertinent Vitals/Pain Pain Assessment: 0-10 Pain Score: 5  Pain Location: L heel Pain Descriptors / Indicators: Sore Pain Intervention(s): Monitored during session;Repositioned     Hand Dominance     Extremity/Trunk Assessment Upper Extremity Assessment Upper Extremity Assessment: Generalized weakness   Lower Extremity Assessment Lower Extremity Assessment: Defer to PT evaluation       Communication Communication Communication: No difficulties   Cognition Arousal/Alertness: Awake/alert Behavior During Therapy: WFL for tasks assessed/performed Overall Cognitive Status: Within Functional Limits for tasks assessed                     General  Comments       Exercises       Shoulder Instructions      Home Living Family/patient expects to be discharged to:: Unsure Living Arrangements: Alone Available Help at Discharge: Family;Available PRN/intermittently (brother checks in on pt  daily) Type of Home: Apartment Home Access: Level entry     Home Layout: One level     Bathroom Shower/Tub:  (only sponge bathes)   Bathroom Toilet: Standard     Home Equipment: Walker - 4 wheels          Prior Functioning/Environment Level of Independence: Needs assistance  Gait / Transfers Assistance Needed: rollator for household mobility; does not go out in community ADL's / Homemaking Assistance Needed: only sponge bathes but independent with bathing and dressing. Brother brings pt her meals            OT Problem List: Decreased strength;Decreased range of motion;Decreased activity tolerance;Impaired balance (sitting and/or standing);Decreased knowledge of use of DME or AE;Decreased knowledge of precautions;Obesity;Pain      OT Treatment/Interventions: Self-care/ADL training;Therapeutic exercise;Energy conservation;DME and/or AE instruction;Therapeutic activities;Patient/family education;Balance training    OT Goals(Current goals can be found in the care plan section) Acute Rehab OT Goals Patient Stated Goal: get stronger OT Goal Formulation: With patient Time For Goal Achievement: 06/16/16 Potential to Achieve Goals: Good ADL Goals Pt Will Perform Grooming: with supervision;sitting Pt Will Perform Upper Body Bathing: with supervision;sitting Pt Will Perform Lower Body Bathing: with supervision;sit to/from stand Pt Will Transfer to Toilet: with min guard assist;ambulating;bedside commode (over toilet) Pt Will Perform Toileting - Clothing Manipulation and hygiene: with supervision;sit to/from stand  OT Frequency: Min 2X/week   Barriers to D/C: Decreased caregiver support  pt lives alone       Co-evaluation              End of Session Equipment Utilized During Treatment: Gait belt;Rolling walker Nurse Communication: Other (comment);Mobility status (RN tech-pt needs to use bathroom)  Activity Tolerance: Patient tolerated treatment well Patient left: in  chair;with call bell/phone within reach;with chair alarm set  OT Visit Diagnosis: Repeated falls (R29.6);Unsteadiness on feet (R26.81);Muscle weakness (generalized) (M62.81)                ADL either performed or assessed with clinical judgement  Time: 0804-0825 OT Time Calculation (min): 21 min Charges:  OT General Charges $OT Visit: 1 Procedure OT Evaluation $OT Eval Moderate Complexity: 1 Procedure G-Codes: OT G-codes **NOT FOR INPATIENT CLASS** Functional Assessment Tool Used: AM-PAC 6 Clicks Daily Activity Functional Limitation: Self care Self Care Current Status (Z6109(G8987): At least 40 percent but less than 60 percent impaired, limited or restricted Self Care Goal Status (U0454(G8988): At least 1 percent but less than 20 percent impaired, limited or restricted   Fredric MareBailey A. Brett Albinooffey, M.S., OTR/L Pager: [phone]  Gaye AlkenBailey A Aikam Hellickson 06/02/2016, 8:35 AM

## 2016-06-02 NOTE — NC FL2 (Signed)
[place] MEDICAID FL2 LEVEL OF CARE SCREENING TOOL     IDENTIFICATION  Patient Name: Brianna David Birthdate: [birth year] Sex: female Admission Date (Current Location): 06/01/2016  [facility] and [place] Number:  Producer, television/film/video and Address:  [facility], [address], [place], [place] [address]      Provider Number: [id number]  Attending Physician Name and Address:  Joseph Art, DO  Relative Name and Phone Number:  Bernie Covey, [phone]    Current Level of Care: Hospital Recommended Level of Care: Skilled Nursing Facility Prior Approval Number:    Date Approved/Denied:   PASRR Number: [id number]  Discharge Plan: SNF    Current Diagnoses: Patient Active Problem List   Diagnosis Date Noted  . Pressure injury of skin 06/02/2016  . Rhabdomyolysis 06/01/2016  . Falls 06/01/2016  . Hematuria 06/01/2016  . Chronic pain 06/01/2016  . Physical deconditioning   . Encounter for immunization 04/15/2016  . HTN (hypertension) 02/20/2013  . Morbid obesity (HCC) 02/20/2013  . DOE (dyspnea on exertion) 02/20/2013    Orientation RESPIRATION BLADDER Height & Weight     Self, Time, Situation, Place  Normal Continent Weight: 95.8 kg (211 lb 4.8 oz) Height:  5\' 8"  (172.7 cm)  BEHAVIORAL SYMPTOMS/MOOD NEUROLOGICAL BOWEL NUTRITION STATUS      Continent Diet (Please see DC Summary)  AMBULATORY STATUS COMMUNICATION OF NEEDS Skin   Extensive Assist Verbally PU Stage and Appropriate Care (Stage II pressure Injury on hip)                       Personal Care Assistance Level of Assistance  Bathing, Feeding, Dressing Bathing Assistance: Maximum assistance Feeding assistance: Independent Dressing Assistance: Limited assistance     Functional Limitations Info             SPECIAL CARE FACTORS FREQUENCY  PT (By licensed PT)     PT Frequency: 5x/week              Contractures      Additional Factors Info  Code Status,  Allergies Code Status Info: Full Allergies Info: Penicillins           Current Medications (06/02/2016):  This is the current hospital active medication list Current Facility-Administered Medications  Medication Dose Route Frequency Provider Last Rate Last Dose  . 0.9 %  sodium chloride infusion   Intravenous Continuous Joseph Art, DO 100 mL/hr at 06/02/16 0458    . aspirin EC tablet 81 mg  81 mg Oral Daily Ozella Rocks, MD   81 mg at 06/02/16 8469  . cefTRIAXone (ROCEPHIN) 1 g in dextrose 5 % 50 mL IVPB  1 g Intravenous Q24H Ozella Rocks, MD   1 g at 06/01/16 2112  . heparin injection 5,000 Units  5,000 Units Subcutaneous Q8H Ozella Rocks, MD   5,000 Units at 06/02/16 [phone]  . hydrALAZINE (APRESOLINE) injection 5-10 mg  5-10 mg Intravenous Q4H PRN Ozella Rocks, MD      . losartan (COZAAR) tablet 100 mg  100 mg Oral Daily Ozella Rocks, MD   100 mg at 06/02/16 2841   And  . hydrochlorothiazide (HYDRODIURIL) tablet 25 mg  25 mg Oral Daily Ozella Rocks, MD   25 mg at 06/02/16 0917  . naproxen sodium (ANAPROX) tablet 275 mg  275 mg Oral Q6H PRN Ozella Rocks, MD      . ondansetron [facility]) tablet 4 mg  4 mg Oral Q6H PRN Ozella Rocksavid J Merrell, MD       Or  . ondansetron [facility](ZOFRAN) injection 4 mg  4 mg Intravenous Q6H PRN Ozella Rocksavid J Merrell, MD      . potassium chloride (K-DUR,KLOR-CON) CR tablet 10 mEq  10 mEq Oral Daily Ozella Rocksavid J Merrell, MD   10 mEq at 06/02/16 0916  . traMADol-acetaminophen (ULTRACET) 37.5-325 MG per tablet 1-2 tablet  1-2 tablet Oral Q6H PRN Ozella Rocksavid J Merrell, MD   1 tablet at 06/02/16 0458  . Zinc Oxide (TRIPLE PASTE) 12.8 % ointment   Topical PRN Joseph ArtJessica U Vann, DO         Discharge Medications: Please see discharge summary for a list of discharge medications.  Relevant Imaging Results:  Relevant Lab Results:   Additional Information SSN: [id number]  Ajene Carchi S HilliardRayyan, [place]CSWA

## 2016-06-02 NOTE — Evaluation (Signed)
Physical Therapy Evaluation Patient Details Name: Brianna David Bergman MRN: [id number] DOB: 1929/08/14 Today's Date: 06/02/2016   History of Present Illness  83 y.o. female with medical history significant of hyperlipidemia, hypertension, obesity presenting to the hospital complaining of right hip pain after multiple falls.  Clinical Impression  Patient presents with decreased mobility due to deficits listed in PT problem list.  Mainly limited by pain, weakness, fear of falling and postural deficits.  Feel she will benefit from skilled PT in the acute setting to allow return home following SNF rehab stay.     Follow Up Recommendations SNF;Supervision/Assistance - 24 hour    Equipment Recommendations  None recommended by PT    Recommendations for Other Services       Precautions / Restrictions Precautions Precautions: Fall Restrictions Weight Bearing Restrictions: No      Mobility  Bed Mobility Overal bed mobility: Needs Assistance Bed Mobility: Supine to Sit     Supine to sit: Min assist;HOB elevated     General bed mobility comments: up in chair   Transfers Overall transfer level: Needs assistance Equipment used: 4-wheeled walker Transfers: Sit to/from Stand Sit to Stand: Max assist;Min assist Stand pivot transfers: Min assist       General transfer comment: from low recliner max A for lifting and lowering, from higher seat of rollator min A with cues for technique   Ambulation/Gait Ambulation/Gait assistance: Mod assist;Min assist Ambulation Distance (Feet): 14 Feet (x 2) Assistive device: 4-wheeled walker Gait Pattern/deviations: Step-to pattern;Trunk flexed;Decreased stride length;Shuffle;Wide base of support     General Gait Details: leans down on R elbow for ambulation, cues for posture, but pt reports unable to straighten up; states she has been walking on her elbow for some time.  mod support at times due to weakness, fatigue with walking around bed to  chair; great difficulty after turning to sit on seat of walker to stand and turn back around needing max cues, and min/mod A.  (initially pt standing trying to swing walker around her, but cues for safer technique)  Stairs            Wheelchair Mobility    Modified Rankin (Stroke Patients Only)       Balance Overall balance assessment: Needs assistance Sitting-balance support: Feet supported Sitting balance-Leahy Scale: Fair     Standing balance support: Bilateral upper extremity supported Standing balance-Leahy Scale: Poor Standing balance comment: UE support and assist for balance                             Pertinent Vitals/Pain Pain Assessment: 0-10 Pain Score: 7  Pain Location: whole R side Pain Descriptors / Indicators: Dull;Aching Pain Intervention(s): Monitored during session;Repositioned    Home Living Family/patient expects to be discharged to:: Skilled nursing facility Living Arrangements: Alone Available Help at Discharge: Family;Available PRN/intermittently (brother checks in daily) Type of Home: Apartment Home Access: Level entry     Home Layout: One level Home Equipment: Walker - 4 wheels      Prior Function Level of Independence: Needs assistance   Gait / Transfers Assistance Needed: rollator for household mobility; does not go out in community  ADL's / Homemaking Assistance Needed: only sponge bathes but independent with bathing and dressing. Brother brings pt her meals        Hand Dominance        Extremity/Trunk Assessment   Upper Extremity Assessment Upper Extremity Assessment: Defer to OT evaluation  Lower Extremity Assessment Lower Extremity Assessment: LLE deficits/detail;RLE deficits/detail RLE Deficits / Details: AAROM WFL, strength hip flexion 2+/5, knee extension 4-/5 with pain, ankle DF 4/5 LLE Deficits / Details: AAROM WFL, strength hip flexion 3-/5, knee extension 4+/5, ankle DF 4+/5    Cervical / Trunk  Assessment Cervical / Trunk Assessment: Kyphotic  Communication   Communication: No difficulties  Cognition Arousal/Alertness: Awake/alert Behavior During Therapy: WFL for tasks assessed/performed Overall Cognitive Status: Within Functional Limits for tasks assessed                      General Comments General comments (skin integrity, edema, etc.): reports falls started about a year ago after falling when going up steps to beauty parlor in the rain, has falled 3 other times since that fall.  Noted bruising on both legs, knees    Exercises     Assessment/Plan    PT Assessment Patient needs continued PT services  PT Problem List Decreased strength;Decreased activity tolerance;Decreased balance;Decreased mobility;Decreased safety awareness;Decreased knowledge of use of DME;Pain;Decreased knowledge of precautions       PT Treatment Interventions DME instruction;Gait training;Stair training;Balance training;Therapeutic activities    PT Goals (Current goals can be found in the Care Plan section)  Acute Rehab PT Goals Patient Stated Goal: get stronger    Frequency Min 3X/week   Barriers to discharge        Co-evaluation               End of Session Equipment Utilized During Treatment: Gait belt Activity Tolerance: Patient limited by fatigue Patient left: in chair;with chair alarm set   PT Visit Diagnosis: Repeated falls (R29.6);Unsteadiness on feet (R26.81);Other abnormalities of gait and mobility (R26.89);Pain    Functional Assessment Tool Used: AM-PAC 6 Clicks Basic Mobility Functional Limitation: Mobility: Walking and moving around Mobility: Walking and Moving Around Current Status [phone]): At least 40 percent but less than 60 percent impaired, limited or restricted Mobility: Walking and Moving Around Goal Status [phone]): At least 20 percent but less than 40 percent impaired, limited or restricted    Time: 0930-1005 PT Time Calculation (min) (ACUTE  ONLY): 35 min   Charges:   PT Evaluation $PT Eval Moderate Complexity: 1 Procedure PT Treatments $Gait Training: 8-22 mins   PT G Codes:   PT G-Codes **NOT FOR INPATIENT CLASS** Functional Assessment Tool Used: AM-PAC 6 Clicks Basic Mobility Functional Limitation: Mobility: Walking and moving around Mobility: Walking and Moving Around Current Status ([id number]): At least 40 percent but less than 60 percent impaired, limited or restricted Mobility: Walking and Moving Around Goal Status [phone]): At least 20 percent but less than 40 percent impaired, limited or restricted     Elray Mcgregor 06/02/2016, 12:03 PM  Sheran Lawless, PT [phone] 06/02/2016

## 2016-06-02 NOTE — Progress Notes (Signed)
Initial Nutrition Assessment  DOCUMENTATION CODES:   Obesity unspecified  INTERVENTION:   -MVI daily -Snacks TID  NUTRITION DIAGNOSIS:   Increased nutrient needs related to wound healing as evidenced by estimated needs.  GOAL:   Patient will meet greater than or equal to 90% of their needs  MONITOR:   PO intake, Labs, Weight trends, Skin, I & O's, Supplement acceptance  REASON FOR ASSESSMENT:   Consult Assessment of nutrition requirement/status  ASSESSMENT:   Brianna David is a 83 y.o. female with medical history significant of hyperlipidemia, hypertension, obesity presenting to the hospital complaining of right hip pain after multiple falls.   Pt admitted with rhabdomyolysis with multiple falls.   Spoke with pt at bedside, who reports that she had a great appetite PTA, however, intake has declined since hospitalization related to pain and soreness from fall. Pt reports she generally snacks on candy, milk, and cereal throughout the day and eats a large meal (meat, starch, and vegetable) at night. Pt consumed more than 50% of breakfast this morning- half of eggs, all of bacon, all of fruit, and all of milk.   Pt reports UBW is around 220#. She reports that she estimates that she has lost some weight in the past few days, but unable to quantify or provide further details. Reviewed wt hx, which reveals UBW of around 235#. Wt hx reveals a 10% wt loss over the past 2 months, however, unsure of accuracy of wt hx.   Nutrition-Focused physical exam completed. Findings are no fat depletion, no muscle depletion, and no edema.   Reviewed CWOCN note from 06/02/16; pt with MASD on inner thighs, perineal area, and buttocks, and stage II to rt trochanter.   Discussed with pt importance of good meal completion to promote healing and discussed ways to add more protein in diet. Pt politely refused addition of supplements, however, agreeable to snacks.   Labs reviewed: K: 3.1 (on PO  supplementation), CBGS: 122.   Diet Order:  Diet regular Room service appropriate? Yes; Fluid consistency: Thin  Skin:  Wound (see comment) (st II rt hip)  Last BM:  06/02/16  Height:   Ht Readings from Last 1 Encounters:  06/01/16 5\' 8"  (1.727 m)    Weight:   Wt Readings from Last 1 Encounters:  06/01/16 211 lb 4.8 oz (95.8 kg)    Ideal Body Weight:  63.6 kg  BMI:  Body mass index is 32.13 kg/m.  Estimated Nutritional Needs:   Kcal:  1700-1900  Protein:  80-95 grams  Fluid:  > 1.7 L  EDUCATION NEEDS:   Education needs addressed  Charisma Charlot A. Mayford KnifeWilliams, RD, LDN, CDE Pager: [phone] After hours Pager: [phone]

## 2016-06-03 DIAGNOSIS — M6281 Muscle weakness (generalized): Secondary | ICD-10-CM | POA: Diagnosis not present

## 2016-06-03 DIAGNOSIS — A499 Bacterial infection, unspecified: Secondary | ICD-10-CM | POA: Diagnosis not present

## 2016-06-03 DIAGNOSIS — M79609 Pain in unspecified limb: Secondary | ICD-10-CM | POA: Diagnosis not present

## 2016-06-03 DIAGNOSIS — M6282 Rhabdomyolysis: Secondary | ICD-10-CM | POA: Diagnosis not present

## 2016-06-03 DIAGNOSIS — W19XXXA Unspecified fall, initial encounter: Secondary | ICD-10-CM | POA: Diagnosis not present

## 2016-06-03 DIAGNOSIS — M25561 Pain in right knee: Secondary | ICD-10-CM | POA: Diagnosis not present

## 2016-06-03 DIAGNOSIS — F329 Major depressive disorder, single episode, unspecified: Secondary | ICD-10-CM | POA: Diagnosis not present

## 2016-06-03 DIAGNOSIS — N390 Urinary tract infection, site not specified: Secondary | ICD-10-CM

## 2016-06-03 DIAGNOSIS — T796XXS Traumatic ischemia of muscle, sequela: Secondary | ICD-10-CM | POA: Diagnosis not present

## 2016-06-03 DIAGNOSIS — G8929 Other chronic pain: Secondary | ICD-10-CM | POA: Diagnosis not present

## 2016-06-03 DIAGNOSIS — R319 Hematuria, unspecified: Secondary | ICD-10-CM | POA: Diagnosis not present

## 2016-06-03 DIAGNOSIS — R41841 Cognitive communication deficit: Secondary | ICD-10-CM | POA: Diagnosis not present

## 2016-06-03 DIAGNOSIS — R296 Repeated falls: Secondary | ICD-10-CM | POA: Diagnosis not present

## 2016-06-03 DIAGNOSIS — R262 Difficulty in walking, not elsewhere classified: Secondary | ICD-10-CM | POA: Diagnosis not present

## 2016-06-03 DIAGNOSIS — T796XXA Traumatic ischemia of muscle, initial encounter: Secondary | ICD-10-CM | POA: Diagnosis not present

## 2016-06-03 DIAGNOSIS — R2681 Unsteadiness on feet: Secondary | ICD-10-CM | POA: Diagnosis not present

## 2016-06-03 DIAGNOSIS — R5381 Other malaise: Secondary | ICD-10-CM | POA: Diagnosis not present

## 2016-06-03 DIAGNOSIS — E876 Hypokalemia: Secondary | ICD-10-CM | POA: Diagnosis not present

## 2016-06-03 DIAGNOSIS — R531 Weakness: Secondary | ICD-10-CM | POA: Diagnosis not present

## 2016-06-03 DIAGNOSIS — M25551 Pain in right hip: Secondary | ICD-10-CM | POA: Diagnosis not present

## 2016-06-03 DIAGNOSIS — G894 Chronic pain syndrome: Secondary | ICD-10-CM | POA: Diagnosis not present

## 2016-06-03 DIAGNOSIS — I10 Essential (primary) hypertension: Secondary | ICD-10-CM | POA: Diagnosis not present

## 2016-06-03 LAB — BASIC METABOLIC PANEL
ANION GAP: 5 (ref 5–15)
BUN: 16 mg/dL (ref 6–20)
CALCIUM: 8 mg/dL — AB (ref 8.9–10.3)
CO2: 24 mmol/L (ref 22–32)
CREATININE: 0.69 mg/dL (ref 0.44–1.00)
Chloride: 110 mmol/L (ref 101–111)
Glucose, Bld: 82 mg/dL (ref 65–99)
Potassium: 3.5 mmol/L (ref 3.5–5.1)
SODIUM: 139 mmol/L (ref 135–145)

## 2016-06-03 LAB — CBC
HCT: 37.3 % (ref 36.0–46.0)
Hemoglobin: 12.3 g/dL (ref 12.0–15.0)
MCH: 32.4 pg (ref 26.0–34.0)
MCHC: 33 g/dL (ref 30.0–36.0)
MCV: 98.2 fL (ref 78.0–100.0)
Platelets: 233 10*3/uL (ref 150–400)
RBC: 3.8 MIL/uL — ABNORMAL LOW (ref 3.87–5.11)
RDW: 14.7 % (ref 11.5–15.5)
WBC: 7.6 10*3/uL (ref 4.0–10.5)

## 2016-06-03 LAB — CK: Total CK: 783 U/L — ABNORMAL HIGH (ref 38–234)

## 2016-06-03 LAB — URINE CULTURE: Culture: NO GROWTH

## 2016-06-03 MED ORDER — TRAMADOL-ACETAMINOPHEN 37.5-325 MG PO TABS: 1.0000 | ORAL_TABLET | Freq: Four times a day (QID) | ORAL | 0 refills | Status: DC | PRN

## 2016-06-03 MED ORDER — ZINC OXIDE 12.8 % EX OINT: TOPICAL_OINTMENT | CUTANEOUS | 0 refills | Status: DC | PRN

## 2016-06-03 MED ORDER — ADULT MULTIVITAMIN W/MINERALS CH: 1.0000 | ORAL_TABLET | Freq: Every day | ORAL | Status: AC

## 2016-06-03 MED ORDER — CEPHALEXIN 500 MG PO CAPS: 500.0000 mg | ORAL_CAPSULE | Freq: Two times a day (BID) | ORAL | Status: DC

## 2016-06-03 NOTE — Progress Notes (Signed)
Physical Therapy Treatment Patient Details Name: Brianna David MRN: [id number] DOB: 02/03/1930 Today's Date: 06/03/2016    History of Present Illness 83 y.o. female with medical history significant of hyperlipidemia, hypertension, obesity presenting to the hospital complaining of right hip pain after multiple falls.    PT Comments    Pt is going to SNF today, has been demonstrating good effort and is able to assist her own mobility better today.  Has been able to use RW today, expressing a bit of concern that she cannot handle rollator and certainly can progress with this in SNF.  Will see again acutely if her dc is held for some reason.   Follow Up Recommendations  SNF     Equipment Recommendations  None recommended by PT    Recommendations for Other Services       Precautions / Restrictions Precautions Precautions: Fall Restrictions Weight Bearing Restrictions: No    Mobility  Bed Mobility Overal bed mobility: Needs Assistance Bed Mobility: Supine to Sit;Sit to Supine     Supine to sit: Min assist Sit to supine: Min assist   General bed mobility comments: assisted trunk out and legs back in  Transfers Overall transfer level: Needs assistance Equipment used: Rolling walker (2 wheeled);1 person hand held assist Transfers: Sit to/from [employer] Sit to Stand: Mod assist Stand pivot transfers: Min assist       General transfer comment: pt able to exert to assist standing process and with extra time can pivot to the chair  Ambulation/Gait Ambulation/Gait assistance: Min assist Ambulation Distance (Feet): 5 Feet (x 2) Assistive device: Rolling walker (2 wheeled) (pt said she fell on rollator at home, wanted RW) Gait Pattern/deviations: Step-to pattern;Trunk flexed;Wide base of support;Decreased stride length Gait velocity: reduced Gait velocity interpretation: Below normal speed for age/gender General Gait Details: propped on R elbow  initially and asked her to stand more upright   Stairs            Wheelchair Mobility    Modified Rankin (Stroke Patients Only)       Balance Overall balance assessment: Needs assistance Sitting-balance support: Feet supported Sitting balance-Leahy Scale: Fair     Standing balance support: Bilateral upper extremity supported Standing balance-Leahy Scale: Poor                      Cognition Arousal/Alertness: Awake/alert Behavior During Therapy: WFL for tasks assessed/performed Overall Cognitive Status: Within Functional Limits for tasks assessed                      Exercises      General Comments General comments (skin integrity, edema, etc.): pt is able to manage use of arms but is slow to stand and slow to assist with LE's to change position, and weak as well      Pertinent Vitals/Pain Pain Assessment: Faces Faces Pain Scale: Hurts little more Pain Location: R hip Pain Descriptors / Indicators: Dull;Sore Pain Intervention(s): Limited activity within patient's tolerance;Monitored during session;Premedicated before session;Repositioned    Home Living                      Prior Function            PT Goals (current goals can now be found in the care plan section) Acute Rehab PT Goals Patient Stated Goal: to get walking Progress towards PT goals: Progressing toward goals    Frequency    Min  3X/week      PT Plan Current plan remains appropriate    Co-evaluation             End of Session Equipment Utilized During Treatment: Gait belt Activity Tolerance: Patient limited by fatigue Patient left: in bed;with call bell/phone within reach Nurse Communication: Mobility status PT Visit Diagnosis: Repeated falls (R29.6);Unsteadiness on feet (R26.81);Other abnormalities of gait and mobility (R26.89);Pain Pain - Right/Left: Right Pain - part of body: Leg     Time: [phone] (and reattempted) PT Time Calculation (min)  (ACUTE ONLY): 21 min  Charges:  $Therapeutic Activity: 8-22 mins                    G Codes:  Functional Assessment Tool Used: AM-PAC 6 Clicks Basic Mobility    Ivar DrapeRuth E Ki Luckman 06/03/2016, 10:59 AM   Samul Dadauth Chisom Aust, PT MS Acute Rehab Dept. Number: [facility] [phone] and [facility] [phone]

## 2016-06-03 NOTE — Consult Note (Signed)
[facility] CM Primary Care Navigator  06/03/2016  Brianna David 1929-08-19 [id number]  Met with patient at the bedside to identify possible discharge needs. Patient reports having right hip pain after fall episodes that had led to this admission.  Patient endorses Brianna David with [facility] as the primary care provider.    Patient shared using [facility] in [place] and [facility] to obtain medications without any problem.   Patient reports managing her medications at home straight out of the containers.   Brother [name]) provides transportation to her doctors' appointments.  Patient reports living alone and brother (POA) will be her primary caregiver at home. Patient verbalized need for caregiver to assist her brother in looking after her when she gets back home to prevent further falls. [facility] list of personal care services provided with voiced understanding that she will pay out of the pocket for it.  Discharge plan is skilled nursing facility ([facility]) for rehabilitation before returning home.  Patient voiced understanding to call primary care provider's office when she returns to home, for a post discharge follow-up appointment within a week or sooner if needs arise. Patient letter (with PCP's contact number) was provided as a reminder.  Patient communicated no further health needs or concerns at present.  For additional questions please contact:  Edwena Felty A. Freeman Borba, BSN, RN-BC [facility] PRIMARY CARE Navigator Cell: [phone]

## 2016-06-03 NOTE — Discharge Instructions (Signed)
Urinary Tract Infection, Adult A urinary tract infection (UTI) is an infection of any part of the urinary tract, which includes the kidneys, ureters, bladder, and urethra. These organs make, store, and get rid of urine in the body. UTI can be a bladder infection (cystitis) or kidney infection (pyelonephritis). What are the causes? This infection may be caused by fungi, viruses, or bacteria. Bacteria are the most common cause of UTIs. This condition can also be caused by repeated incomplete emptying of the bladder during urination. What increases the risk? This condition is more likely to develop if:  You ignore your need to urinate or hold urine for Kirstein periods of time.  You do not empty your bladder completely during urination.  You wipe back to front after urinating or having a bowel movement, if you are female.  You are uncircumcised, if you are female.  You are constipated.  You have a urinary catheter that stays in place (indwelling).  You have a weak defense (immune) system.  You have a medical condition that affects your bowels, kidneys, or bladder.  You have diabetes.  You take antibiotic medicines frequently or for Mikami periods of time, and the antibiotics no longer work well against certain types of infections (antibiotic resistance).  You take medicines that irritate your urinary tract.  You are exposed to chemicals that irritate your urinary tract.  You are female. What are the signs or symptoms? Symptoms of this condition include:  Fever.  Frequent urination or passing small amounts of urine frequently.  Needing to urinate urgently.  Pain or burning with urination.  Urine that smells bad or unusual.  Cloudy urine.  Pain in the lower abdomen or back.  Trouble urinating.  Blood in the urine.  Vomiting or being less hungry than normal.  Diarrhea or abdominal pain.  Vaginal discharge, if you are female. How is this diagnosed? This condition is  diagnosed with a medical history and physical exam. You will also need to provide a urine sample to test your urine. Other tests may be done, including:  Blood tests.  Sexually transmitted disease (STD) testing. If you have had more than one UTI, a cystoscopy or imaging studies may be done to determine the cause of the infections. How is this treated? Treatment for this condition often includes a combination of two or more of the following:  Antibiotic medicine.  Other medicines to treat less common causes of UTI.  Over-the-counter medicines to treat pain.  Drinking enough water to stay hydrated. Follow these instructions at home:  Take over-the-counter and prescription medicines only as told by your health care provider.  If you were prescribed an antibiotic, take it as told by your health care provider. Do not stop taking the antibiotic even if you start to feel better.  Avoid alcohol, caffeine, tea, and carbonated beverages. They can irritate your bladder.  Drink enough fluid to keep your urine clear or pale yellow.  Keep all follow-up visits as told by your health care provider. This is important.  Make sure to:  Empty your bladder often and completely. Do not hold urine for Ritter periods of time.  Empty your bladder before and after sex.  Wipe from front to back after a bowel movement if you are female. Use each tissue one time when you wipe. Contact a health care provider if:  You have back pain.  You have a fever.  You feel nauseous or vomit.  Your symptoms do not get better after 3  days.  Your symptoms go away and then return. Get help right away if:  You have severe back pain or lower abdominal pain.  You are vomiting and cannot keep down any medicines or water. This information is not intended to replace advice given to you by your health care provider. Make sure you discuss any questions you have with your health care provider. Document Released:  12/30/2004 Document Revised: 09/03/2015 Document Reviewed: 02/10/2015 Elsevier Interactive Patient Education  2017 [employer] Fall Prevention in the Home Falls can cause injuries and can affect people from all age groups. There are many simple things that you can do to make your home safe and to help prevent falls. What can I do on the outside of my home?  Regularly repair the edges of walkways and driveways and fix any cracks.  Remove high doorway thresholds.  Trim any shrubbery on the main path into your home.  Use bright outdoor lighting.  Clear walkways of debris and clutter, including tools and rocks.  Regularly check that handrails are securely fastened and in good repair. Both sides of any steps should have handrails.  Install guardrails along the edges of any raised decks or porches.  Have leaves, snow, and ice cleared regularly.  Use sand or salt on walkways during winter months.  In the garage, clean up any spills right away, including grease or oil spills. What can I do in the bathroom?  Use night lights.  Install grab bars by the toilet and in the tub and shower. Do not use towel bars as grab bars.  Use non-skid mats or decals on the floor of the tub or shower.  If you need to sit down while you are in the shower, use a plastic, non-slip stool.  Keep the floor dry. Immediately clean up any water that spills on the floor.  Remove soap buildup in the tub or shower on a regular basis.  Attach bath mats securely with double-sided non-slip rug tape.  Remove throw rugs and other tripping hazards from the floor. What can I do in the bedroom?  Use night lights.  Make sure that a bedside light is easy to reach.  Do not use oversized bedding that drapes onto the floor.  Have a firm chair that has side arms to use for getting dressed.  Remove throw rugs and other tripping hazards from the floor. What can I do in the kitchen?  Clean up any spills right  away.  Avoid walking on wet floors.  Place frequently used items in easy-to-reach places.  If you need to reach for something above you, use a sturdy step stool that has a grab bar.  Keep electrical cables out of the way.  Do not use floor polish or wax that makes floors slippery. If you have to use wax, make sure that it is non-skid floor wax.  Remove throw rugs and other tripping hazards from the floor. What can I do in the stairways?  Do not leave any items on the stairs.  Make sure that there are handrails on both sides of the stairs. Fix handrails that are broken or loose. Make sure that handrails are as long as the stairways.  Check any carpeting to make sure that it is firmly attached to the stairs. Fix any carpet that is loose or worn.  Avoid having throw rugs at the top or bottom of stairways, or secure the rugs with carpet tape to prevent them from moving.  Make sure  that you have a light switch at the top of the stairs and the bottom of the stairs. If you do not have them, have them installed. What are some other fall prevention tips?  Wear closed-toe shoes that fit well and support your feet. Wear shoes that have rubber soles or low heels.  When you use a stepladder, make sure that it is completely opened and that the sides are firmly locked. Have someone hold the ladder while you are using it. Do not climb a closed stepladder.  Add color or contrast paint or tape to grab bars and handrails in your home. Place contrasting color strips on the first and last steps.  Use mobility aids as needed, such as canes, walkers, scooters, and crutches.  Turn on lights if it is dark. Replace any light bulbs that burn out.  Set up furniture so that there are clear paths. Keep the furniture in the same spot.  Fix any uneven floor surfaces.  Choose a carpet design that does not hide the edge of steps of a stairway.  Be aware of any and all pets.  Review your medicines with your  healthcare provider. Some medicines can cause dizziness or changes in blood pressure, which increase your risk of falling. Talk with your health care provider about other ways that you can decrease your risk of falls. This may include working with a physical therapist or trainer to improve your strength, balance, and endurance. This information is not intended to replace advice given to you by your health care provider. Make sure you discuss any questions you have with your health care provider. Document Released: 03/12/2002 Document Revised: 08/19/2015 Document Reviewed: 04/26/2014 Elsevier Interactive Patient Education  2017 [employer].

## 2016-06-03 NOTE — Care Management Note (Signed)
Case Management Note  Patient Details  Name: Lawson RadarDorothy M Schneller MRN: [id number] Date of Birth: 10/22/1929  Subjective/Objective:   Admitted with rhabdomyolysis/ s/p fall,  hx of hyperlipidemia, hypertension, obesity   From home alone.                 Action/Plan: Plan is to d/c to SNF today. CSW managing disposition to SNF.  Expected Discharge Date:  06/03/16               Expected Discharge Plan:  Skilled Nursing Facility  In-House Referral:  Clinical Social Work  Discharge planning Services  CM Consult Status of Service:  Completed, signed off  If discussed at [employer] Length of Stay Meetings, dates discussed:    Additional Comments:  Epifanio LeschesCole, Chessa Barrasso Hudson, RN 06/03/2016, 10:42 AM

## 2016-06-03 NOTE — Progress Notes (Signed)
Patient will DC to: [facility] Anticipated DC date: 06/03/16 Family notified: Brother Transport by: Sharin MonsPTAR 1:30pm   Per MD patient ready for DC to [facility]. RN, patient, patient's family, and facility notified of DC. Discharge Summary sent to facility. RN given number for report. DC packet on chart. Ambulance transport requested for patient.   CSW signing off.  Cristobal GoldmannNadia Tuwana Kapaun, [place] Clinical Social Worker [phone]

## 2016-06-03 NOTE — Clinical Social Work Placement (Signed)
   CLINICAL SOCIAL WORK PLACEMENT  NOTE  Date:  06/03/2016  Patient Details  Name: Brianna David MRN: [id number] Date of Birth: 01-Feb-1930  Clinical Social Work is seeking post-discharge placement for this patient at the Skilled  Nursing Facility level of care (*CSW will initial, date and re-position this form in  chart as items are completed):  Yes   Patient/family provided with [place] Clinical Social Work Department's list of facilities offering this level of care within the geographic area requested by the patient (or if unable, by the patient's family).  Yes   Patient/family informed of their freedom to choose among providers that offer the needed level of care, that participate in Medicare, Medicaid or managed care program needed by the patient, have an available bed and are willing to accept the patient.  Yes   Patient/family informed of [facility]'s ownership interest in [facility] and [facility], as well as of the fact that they are under no obligation to receive care at these facilities.  PASRR submitted to EDS on 06/02/16     PASRR number received on 06/02/16     Existing PASRR number confirmed on       FL2 transmitted to all facilities in geographic area requested by pt/family on 06/02/16     FL2 transmitted to all facilities within larger geographic area on       Patient informed that his/her managed care company has contracts with or will negotiate with certain facilities, including the following:        Yes   Patient/family informed of bed offers received.  Patient [facility] bed [facility]     Physician recommends and patient chooses bed at      Patient to be transferred to [facility] on 06/03/16.  Patient to be transferred to facility by PTAR     Patient family notified on 06/03/16 of transfer.  Name of family member notified:  Molly Maduroobert     PHYSICIAN Please sign FL2     Additional Comment:     _______________________________________________ Mearl LatinNadia S Atharv Barriere, LCSWA 06/03/2016, 12:32 PM

## 2016-06-03 NOTE — Discharge Summary (Signed)
Physician Discharge Summary  Brianna David ZOX:[id number] DOB: 17-Nov-1929 DOA: 06/01/2016  PCP: Gwynneth Aliment, MD  Admit date: 06/01/2016 Discharge date: 06/03/2016   Recommendations for Outpatient Follow-Up:   1. Urine culture pending final result but sent after abx began 2. Keflex until 3/3   Discharge Diagnosis:   Active Problems:   HTN (hypertension)   Rhabdomyolysis   Falls   Hematuria   Chronic pain   Physical deconditioning   Pressure injury of skin   Discharge disposition:  SNF  Discharge Condition: Improved.  Diet recommendation: .  Regular.  Wound care: None.   History of Present Illness:   Brianna David is a 83 y.o. female with medical history significant of hyperlipidemia, hypertension, obesity presenting to the hospital complaining of right hip pain after multiple falls. History provided in part by patient and patient's brother found the patient down just prior to arrival. Patient reports falling when her walker collapsed. Patient states this was a mechanical failure on the walker as she did not fully lock it fall occurred at 01:00 on 05/30/2016. Patient was unable to get up at that time and remained on the floor until the following morning when a family member found her and helped her up. Patient denies LOC, headache, neck stiffness, dizziness/vertigo, fever, abdominal pain, chest pain, palpitations, nausea, vomiting. Patient fell while trying to ambulate to the bathroom and ended at wetting herself she is unable to hold her urine any longer. Patient denies any dysuria or frequency. Patient fell again tonight of 05/31/2016 at approximately 17:00 where she stayed on the floor throughout the night again. Family found patient on the floor the morning of admission and brought her to the emergency room. Denies any seizure type activity or loss of bowel or bladder function.   Hospital Course by Problem:   Rhabdomyolysis:  -Likely  from spending extended  periods on ground over two previous nights. Cr 0.87.  - CK < 1000  Multiple falls: pt lives by self. Mechanical falls from not using walker properly but pt unable to get up off floor. Likely from age related deconditioning  -SNF recommended by PT  Abnormal Urine: concern for UTI given deconditioning and falls - urine culture sent after abx started -treat for 5 days with cephalosporin  HTN: - continue Hyzaar  Non-obstructive CAD:  - continue ASA  Chronic pain: - continue Ultracet, Aleve  Hypokalemia -replete    Medical Consultants:    None.   Discharge Exam:   Vitals:   06/02/16 2208 06/03/16 0503  BP: (!) 97/45 (!) 118/46  Pulse: 65 66  Resp: 18 18  Temp: 98.3 F (36.8 C) 98.1 F (36.7 C)   Vitals:   06/02/16 1358 06/02/16 1747 06/02/16 2208 06/03/16 0503  BP: 119/60 (!) 110/49 (!) 97/45 (!) 118/46  Pulse: 78 71 65 66  Resp: 20 18 18 18   Temp: 97.8 F (36.6 C) 98.3 F (36.8 C) 98.3 F (36.8 C) 98.1 F (36.7 C)  TempSrc: Oral Oral Oral Oral  SpO2: 99% 100% 96% 100%  Weight:      Height:        Gen:  NAD    The results of significant diagnostics from this hospitalization (including imaging, microbiology, ancillary and laboratory) are listed below for reference.     Procedures and Diagnostic Studies:   Dg Chest 1 View  Result Date: 06/01/2016 CLINICAL DATA:  Multiple recent falls.  Weakness. EXAM: CHEST 1 VIEW COMPARISON:  08/02/2008 FINDINGS: Heart size  and pulmonary vascularity are normal and the lungs are clear. Severe arthritis of the left shoulder joint with mild arthritis of the right shoulder joint. IMPRESSION: No acute abnormalities. Electronically Signed   By: Francene Boyers M.D.   On: 06/01/2016 11:27   Dg Hip Unilat W Or Wo Pelvis 2-3 Views Right  Result Date: 06/01/2016 CLINICAL DATA:  Right hip pain. Multiple recent falls, the most recent 5 days ago. EXAM: DG HIP (WITH OR WITHOUT PELVIS) 2-3V RIGHT COMPARISON:  Radiographs dated  01/26/2015 FINDINGS: The patient has severe osteoarthritis of the right hip with erosion of the superior aspect of the right acetabulum with prominent marginal osteophytes on the acetabulum and femoral head with erosions of the acetabulum and femoral head. There are less severe osteoarthritic changes of the left hip. There is no fracture or dislocation. IMPRESSION: No acute bone abnormality. Progressive severe osteoarthritis of the right hip. Electronically Signed   By: Francene Boyers M.D.   On: 06/01/2016 11:26     Labs:   Basic Metabolic Panel:  Recent Labs Lab 06/01/16 1031 06/02/16 0134 06/03/16 0611  NA 140 140 139  K 3.2* 3.1* 3.5  CL 101 106 110  CO2 25 27 24   GLUCOSE 109* 114* 82  BUN 20 20 16   CREATININE 0.87 0.76 0.69  CALCIUM 9.4 8.4* 8.0*   GFR Estimated Creatinine Clearance: 62.3 mL/min (by C-G formula based on SCr of 0.69 mg/dL). Liver Function Tests:  Recent Labs Lab 06/01/16 1031  AST 99*  ALT 32  ALKPHOS 52  BILITOT 3.2*  PROT 7.5  ALBUMIN 3.3*   No results for input(s): LIPASE, AMYLASE in the last 168 hours. No results for input(s): AMMONIA in the last 168 hours. Coagulation profile No results for input(s): INR, PROTIME in the last 168 hours.  CBC:  Recent Labs Lab 06/01/16 1031 06/02/16 0134 06/03/16 0611  WBC 15.4* 11.8* 7.6  NEUTROABS 12.9*  --   --   HGB 14.5 12.1 12.3  HCT 42.4 36.1 37.3  MCV 96.1 96.5 98.2  PLT 271 236 233   Cardiac Enzymes:  Recent Labs Lab 06/01/16 1031 06/01/16 1807 06/02/16 0134 06/03/16 0611  CKTOTAL 2,812* 2,264* 1,690* 783*   BNP: Invalid input(s): POCBNP CBG:  Recent Labs Lab 06/01/16 2245  GLUCAP 122*   D-Dimer No results for input(s): DDIMER in the last 72 hours. Hgb A1c No results for input(s): HGBA1C in the last 72 hours. Lipid Profile No results for input(s): CHOL, HDL, LDLCALC, TRIG, CHOLHDL, LDLDIRECT in the last 72 hours. Thyroid function studies No results for input(s): TSH,  T4TOTAL, T3FREE, THYROIDAB in the last 72 hours.  Invalid input(s): FREET3 Anemia work up No results for input(s): VITAMINB12, FOLATE, FERRITIN, TIBC, IRON, RETICCTPCT in the last 72 hours. Microbiology No results found for this or any previous visit (from the past 240 hour(s)).   Discharge Instructions:   Discharge Instructions    Diet general    Complete by:  As directed    Increase activity slowly    Complete by:  As directed      Allergies as of 06/03/2016      Reactions   Penicillins Other (See Comments)   Unknown childhood allergy      Medication List    STOP taking these medications   HYDROcodone-acetaminophen 5-325 MG tablet Commonly known as:  NORCO/VICODIN     TAKE these medications   ALEVE 220 MG Caps Generic drug:  Naproxen Sodium Take 220 mg by mouth every 6 (  six) hours.   aspirin 81 MG tablet Take 1 tablet (81 mg total) by mouth daily.   cephALEXin 500 MG capsule Commonly known as:  KEFLEX Take 1 capsule (500 mg total) by mouth 2 (two) times daily.   Cholecalciferol 1000 units capsule Take 1,000 Units by mouth daily.   losartan-hydrochlorothiazide 100-25 MG tablet Commonly known as:  HYZAAR Take 1 tablet by mouth daily.   multivitamin with minerals Tabs tablet Take 1 tablet by mouth daily.   potassium chloride 10 MEQ CR capsule Commonly known as:  MICRO-K Take 1 capsule by mouth every morning.   traMADol-acetaminophen 37.5-325 MG tablet Commonly known as:  ULTRACET Take 1-2 tablets by mouth every 6 (six) hours as needed for severe pain.   Zinc Oxide 12.8 % ointment Commonly known as:  TRIPLE PASTE Apply topically as needed for irritation.      Follow-up Information    Gwynneth Alimentobyn N Sanders, MD Follow up in 1 week(s).   Specialty:  Internal Medicine Contact information: [address] [place] [address] [phone]            Time coordinating discharge: 37 min  Signed:  Fay Bagg Juanetta GoslingU Darshawn Boateng   [facility] 06/03/2016, 8:57 AM

## 2016-06-04 ENCOUNTER — Non-Acute Institutional Stay (SKILLED_NURSING_FACILITY): Payer: Medicare HMO | Admitting: Internal Medicine

## 2016-06-04 ENCOUNTER — Encounter: Payer: Self-pay | Admitting: Internal Medicine

## 2016-06-04 DIAGNOSIS — T796XXS Traumatic ischemia of muscle, sequela: Secondary | ICD-10-CM

## 2016-06-04 DIAGNOSIS — A499 Bacterial infection, unspecified: Secondary | ICD-10-CM | POA: Diagnosis not present

## 2016-06-04 DIAGNOSIS — I10 Essential (primary) hypertension: Secondary | ICD-10-CM | POA: Diagnosis not present

## 2016-06-04 DIAGNOSIS — M25551 Pain in right hip: Secondary | ICD-10-CM | POA: Diagnosis not present

## 2016-06-04 DIAGNOSIS — E876 Hypokalemia: Secondary | ICD-10-CM

## 2016-06-04 DIAGNOSIS — N390 Urinary tract infection, site not specified: Secondary | ICD-10-CM

## 2016-06-04 DIAGNOSIS — R531 Weakness: Secondary | ICD-10-CM | POA: Diagnosis not present

## 2016-06-04 NOTE — Progress Notes (Signed)
LOCATION: [facility]  PCP: Gwynneth Aliment, MD   Code Status: Full Code  Goals of care: Advanced Directive information Advanced Directives 06/01/2016  Does Patient Have a Medical Advance Directive? No  Would patient like information on creating a medical advance directive? No - Patient declined       Extended Emergency Contact Information Primary Emergency Contact: CHILES,ROBERT SR Address: [address] [place]          [place]  [address] Home Phone: [phone] Mobile Phone: [phone] Relation: None   Allergies  Allergen Reactions  . Penicillins Other (See Comments)    Unknown childhood allergy    Chief Complaint  Patient presents with  . New Admit To SNF    New Admission Visit      HPI:  Patient is a 83 y.o. female seen today for short term rehabilitation post hospital admission from 06/01/2016-06/03/2016 post fall with rhabdomyolysis and dehydration. She also was diagnosed with urinary tract infection. She responded well to IV fluid and IV antibiotic. She is seen in her room today. She has medical history of essential hypertension, hyperlipidemia, obesity, frequent falls, coronary artery disease, chronic pain among others.  Review of Systems:  Constitutional: Negative for fever, chills, diaphoresis.  HENT: Negative for headache, congestion, nasal discharge, difficulty swallowing.   Eyes: Negative for eye pain, blurred vision, double vision and discharge.  Respiratory: Negative for shortness of breath and wheezing. Positive for cough with phlegm.  Cardiovascular: Negative for chest pain, palpitations.  Gastrointestinal: Negative for heartburn, nausea, vomiting, abdominal pain, loss of appetite. Last bowel movement was today. Genitourinary: Negative for dysuria. Musculoskeletal: Negative for back pain, fall in the facility. Positive for right hip pain  Skin: Negative for itching, rash.  Neurological: Positive for dizziness with change of  position. Psychiatric/Behavioral: Negative for depression.   Past Medical History:  Diagnosis Date  . Dyspnea   . Hyperlipidemia   . Hypertension   . Morbid obesity (HCC)    Past Surgical History:  Procedure Laterality Date  . CARDIAC CATHETERIZATION  08/07/2008   Normal coronary arteries and LV function, mildly elevated R heart pressures-due to morbid obesity, continued on aggresive risk factor management  . CARDIOVASCULAR STRESS TEST  07/12/2008   Evidence of moderate ischemia seen in Basal Anterior, Mid Anterior, Apical Anterior, and Apical regions. EKG is negative for ischemia.  [facility] PARTIAL HYSTERECTOMY  1972  . TRANSTHORACIC ECHOCARDIOGRAM  07/12/2008   EF 73%, mild AI, trace MR, PR, and TR   Social History:   reports that she has quit smoking. She has never used smokeless tobacco. She reports that she does not drink alcohol or use drugs.  Family History  Problem Relation Age of Onset  . Cancer Mother   . Heart attack Father   . Heart failure Maternal Grandmother   . Heart failure Maternal Grandfather     Medications: Allergies as of 06/04/2016      Reactions   Penicillins Other (See Comments)   Unknown childhood allergy      Medication List       Accurate as of 06/04/16  3:02 PM. Always use your most recent med list.          aspirin 81 MG tablet Take 1 tablet (81 mg total) by mouth daily.   cephALEXin 500 MG capsule Commonly known as:  KEFLEX Take 1 capsule (500 mg total) by mouth 2 (two) times daily.   Cholecalciferol 1000 units capsule Take 1,000 Units by mouth daily.  losartan-hydrochlorothiazide 100-25 MG tablet Commonly known as:  HYZAAR Take 1 tablet by mouth daily.   multivitamin with minerals Tabs tablet Take 1 tablet by mouth daily.   potassium chloride 10 MEQ CR capsule Commonly known as:  MICRO-K Take 1 capsule by mouth every morning.   traMADol-acetaminophen 37.5-325 MG tablet Commonly known as:  ULTRACET Take 1-2 tablets by mouth every 6  (six) hours as needed for severe pain.       Immunizations: Immunization History  Administered Date(s) Administered  . Influenza,inj,Quad PF,36+ Mos 02/20/2013, 03/06/2014, 04/15/2016     Physical Exam: Vitals:   06/04/16 1454  BP: 124/78  Pulse: 80  Resp: 20  Temp: 97.2 F (36.2 C)  TempSrc: Oral  SpO2: 97%  Weight: 211 lb 4.8 oz (95.8 kg)  Height: 5\' 11"  (1.803 m)   Body mass index is 29.47 kg/m.  General- elderly female, well built, in no acute distress Head- normocephalic, atraumatic Nose- no nasal discharge Throat- moist mucus membrane Eyes- PERRLA, EOMI, no pallor, no icterus, no discharge, normal conjunctiva, normal sclera Neck- no cervical lymphadenopathy Cardiovascular- normal s1,s2, no murmur Respiratory- bilateral clear to auscultation, no wheeze, no rhonchi, no crackles, no use of accessory muscles Abdomen- bowel sounds present, soft, non tender, no guarding or rigidity Musculoskeletal- able to move all 4 extremities, generalized weakness, limited range of motion to left shoulder, limited range of motion to right hip with pain, no visible bruising or swelling noted on right hip, trace leg edema Neurological- alert and oriented to person, place and time Skin- warm and dry Psychiatry- normal mood and affect    Labs reviewed: Basic Metabolic Panel:  Recent Labs  88/41/66 1031 06/02/16 0134 06/03/16 0611  NA 140 140 139  K 3.2* 3.1* 3.5  CL 101 106 110  CO2 25 27 24   GLUCOSE 109* 114* 82  BUN 20 20 16   CREATININE 0.87 0.76 0.69  CALCIUM 9.4 8.4* 8.0*   Liver Function Tests:  Recent Labs  06/01/16 1031  AST 99*  ALT 32  ALKPHOS 52  BILITOT 3.2*  PROT 7.5  ALBUMIN 3.3*   No results for input(s): LIPASE, AMYLASE in the last 8760 hours. No results for input(s): AMMONIA in the last 8760 hours. CBC:  Recent Labs  06/01/16 1031 06/02/16 0134 06/03/16 0611  WBC 15.4* 11.8* 7.6  NEUTROABS 12.9*  --   --   HGB 14.5 12.1 12.3  HCT 42.4  36.1 37.3  MCV 96.1 96.5 98.2  PLT 271 236 233   Cardiac Enzymes:  Recent Labs  06/01/16 1807 06/02/16 0134 06/03/16 0611  CKTOTAL 2,264* 1,690* 783*   BNP: Invalid input(s): POCBNP CBG:  Recent Labs  06/01/16 2245  GLUCAP 122*    Radiological Exams: Dg Chest 1 View  Result Date: 06/01/2016 CLINICAL DATA:  Multiple recent falls.  Weakness. EXAM: CHEST 1 VIEW COMPARISON:  08/02/2008 FINDINGS: Heart size and pulmonary vascularity are normal and the lungs are clear. Severe arthritis of the left shoulder joint with mild arthritis of the right shoulder joint. IMPRESSION: No acute abnormalities. Electronically Signed   By: Francene Boyers M.D.   On: 06/01/2016 11:27   Dg Hip Unilat W Or Wo Pelvis 2-3 Views Right  Result Date: 06/01/2016 CLINICAL DATA:  Right hip pain. Multiple recent falls, the most recent 5 days ago. EXAM: DG HIP (WITH OR WITHOUT PELVIS) 2-3V RIGHT COMPARISON:  Radiographs dated 01/26/2015 FINDINGS: The patient has severe osteoarthritis of the right hip with erosion of the superior aspect  of the right acetabulum with prominent marginal osteophytes on the acetabulum and femoral head with erosions of the acetabulum and femoral head. There are less severe osteoarthritic changes of the left hip. There is no fracture or dislocation. IMPRESSION: No acute bone abnormality. Progressive severe osteoarthritis of the right hip. Electronically Signed   By: Francene BoyersJames  Maxwell M.D.   On: 06/01/2016 11:26    Assessment/Plan  Generalized weakness From deconditioning from recent fall and urinary tract infection.Will have patient work with PT/OT as tolerated to regain strength and restore function.  Fall precautions are in place.  Rhabdomyolysis Status post IV fluids. Maintain hydration. Fall precautions to be taken. Monitor clinically  Urinary tract infection Denies any symptoms this visit. Encouraged hydration. Continue and complete course of cephalexin 500 mg every 12 hours on  06/05/16.  Right hip pain Reviewed x-ray showing severe osteoarthritis. Will get PMR consult to evaluate further. Currently on Ultracet 1-2 tablet every 6 hours as needed for pain. Discontinue daily for  Hypertension Monitor BMP and blood pressure reading. Continue losartan-hydrochlorothiazide 100-25 milligrams daily.  Hypokalemia Continue potassium supplement    Goals of care: short term rehabilitation   Labs/tests ordered: CBC, CMP 06/08/2016  Family/ staff Communication: reviewed care plan with patient and nursing supervisor    Oneal GroutMAHIMA Sinthia Karabin, MD Internal Medicine [facility] [address] [place], [place] [address] Cell Phone (Monday-Friday 8 am - 5 pm): [phone] On Call: [phone] and follow prompts after 5 pm and on weekends Office Phone: [phone] Office Fax: [phone]

## 2016-06-07 DIAGNOSIS — M79609 Pain in unspecified limb: Secondary | ICD-10-CM | POA: Diagnosis not present

## 2016-06-09 DIAGNOSIS — M79609 Pain in unspecified limb: Secondary | ICD-10-CM | POA: Diagnosis not present

## 2016-06-21 DIAGNOSIS — M79609 Pain in unspecified limb: Secondary | ICD-10-CM | POA: Diagnosis not present

## 2016-06-30 DIAGNOSIS — M79609 Pain in unspecified limb: Secondary | ICD-10-CM | POA: Diagnosis not present

## 2016-07-04 DIAGNOSIS — R2681 Unsteadiness on feet: Secondary | ICD-10-CM | POA: Diagnosis not present

## 2016-07-04 DIAGNOSIS — G894 Chronic pain syndrome: Secondary | ICD-10-CM | POA: Diagnosis not present

## 2016-07-04 DIAGNOSIS — R296 Repeated falls: Secondary | ICD-10-CM | POA: Diagnosis not present

## 2016-07-04 DIAGNOSIS — R41841 Cognitive communication deficit: Secondary | ICD-10-CM | POA: Diagnosis not present

## 2016-07-04 DIAGNOSIS — M6281 Muscle weakness (generalized): Secondary | ICD-10-CM | POA: Diagnosis not present

## 2016-07-04 DIAGNOSIS — R262 Difficulty in walking, not elsewhere classified: Secondary | ICD-10-CM | POA: Diagnosis not present

## 2016-07-04 DIAGNOSIS — R531 Weakness: Secondary | ICD-10-CM | POA: Diagnosis not present

## 2016-07-04 DIAGNOSIS — M6282 Rhabdomyolysis: Secondary | ICD-10-CM | POA: Diagnosis not present

## 2016-07-08 ENCOUNTER — Non-Acute Institutional Stay (SKILLED_NURSING_FACILITY): Payer: Medicare HMO | Admitting: Family

## 2016-07-08 ENCOUNTER — Encounter: Payer: Self-pay | Admitting: Family

## 2016-07-08 DIAGNOSIS — M25561 Pain in right knee: Secondary | ICD-10-CM

## 2016-07-08 DIAGNOSIS — G8929 Other chronic pain: Secondary | ICD-10-CM | POA: Diagnosis not present

## 2016-07-08 DIAGNOSIS — I10 Essential (primary) hypertension: Secondary | ICD-10-CM

## 2016-07-08 NOTE — Progress Notes (Signed)
Location:  [facility] Room Number: 801A Place of Service:  SNF (31) Provider:  Richarda Blade, NP  Brianna Aliment, MD  Patient Care Team: Brianna Peng, MD as PCP - General (Internal Medicine)  Extended Emergency Contact Information Primary Emergency Contact: [facility] Address: [address] [place]          [place]  [address] Home Phone: [phone] Mobile Phone: [phone] Relation: None  Code Status:  Full Code Goals of care: Advanced Directive information Advanced Directives 07/08/2016  Does Patient Have a Medical Advance Directive? No  Would patient like information on creating a medical advance directive? -     Chief Complaint  Patient presents with  . Medical Management of Chronic Issues    Routine Visit    HPI:  Pt is a 83 y.o. female seen today for medical management of chronic diseases.she has a medical history of HTN, OA, morbid obesity among other conditions.She is seen in her room today. She complains of right knee pain states worse after walking with therapy.Of note she is here for short term rehabilitation post hospital admission 06/01/2016-06/03/2016 post fall with rhabdomyolysis and dehydration. She continues to work with therapy.Facility Nurse reports no new concerns.    Past Medical History:  Diagnosis Date  . Dyspnea   . Hyperlipidemia   . Hypertension   . Morbid obesity (HCC)    Past Surgical History:  Procedure Laterality Date  . CARDIAC CATHETERIZATION  08/07/2008   Normal coronary arteries and LV function, mildly elevated R heart pressures-due to morbid obesity, continued on aggresive risk factor management  . CARDIOVASCULAR STRESS TEST  07/12/2008   Evidence of moderate ischemia seen in Basal Anterior, Mid Anterior, Apical Anterior, and Apical regions. EKG is negative for ischemia.  Marland Kitchen PARTIAL HYSTERECTOMY  1972  . TRANSTHORACIC ECHOCARDIOGRAM  07/12/2008   EF 73%, mild AI, trace MR, PR, and TR    Allergies    Allergen Reactions  . Penicillins Other (See Comments)    Unknown childhood allergy    Allergies as of 07/08/2016      Reactions   Penicillins Other (See Comments)   Unknown childhood allergy      Medication List       Accurate as of 07/08/16 10:56 AM. Always use your most recent med list.          aspirin 81 MG chewable tablet Chew 81 mg by mouth daily.   BIOFREEZE 4 % Gel Generic drug:  Menthol (Topical Analgesic) Apply 1 application to left shoulder and left ankle every 8 hours as needed for pain   cephALEXin 500 MG capsule Commonly known as:  KEFLEX Take 1 capsule (500 mg total) by mouth 2 (two) times daily.   Cholecalciferol 1000 units capsule Take 1,000 Units by mouth daily.   feeding supplement (PRO-STAT SUGAR FREE 64) Liqd Take 30 mLs by mouth daily.   losartan-hydrochlorothiazide 100-25 MG tablet Commonly known as:  HYZAAR Take 1 tablet by mouth daily.   multivitamin with minerals Tabs tablet Take 1 tablet by mouth daily.   potassium chloride 10 MEQ CR capsule Commonly known as:  MICRO-K Take 1 capsule by mouth every morning.   traMADol-acetaminophen 37.5-325 MG tablet Commonly known as:  ULTRACET Take 1-2 tablets by mouth every 6 (six) hours as needed. Take 1 tablet for moderate pain (1-5) , take 2 tablets for severe pain (6-10)       Review of Systems  Constitutional: Negative for activity change, appetite  change, chills, fatigue and fever.  HENT: Negative for congestion, rhinorrhea, sinus pain, sinus pressure, sneezing and sore throat.   Eyes: Negative.   Respiratory: Negative for cough, chest tightness, shortness of breath and wheezing.   Cardiovascular: Negative for chest pain, palpitations and leg swelling.  Gastrointestinal: Negative for abdominal distention, abdominal pain, constipation, diarrhea and vomiting.  Endocrine: Negative.   Genitourinary: Negative for dysuria, flank pain, frequency and urgency.  Musculoskeletal: Positive for gait  problem.       Right shoulder, right hip/knee pain   Skin: Negative for color change, pallor, rash and wound.  Neurological: Negative for dizziness, seizures, syncope, light-headedness and headaches.  Hematological: Does not bruise/bleed easily.  Psychiatric/Behavioral: Negative for agitation, confusion, hallucinations and sleep disturbance. The patient is not nervous/anxious.     Immunization History  Administered Date(s) Administered  . Influenza,inj,Quad PF,36+ Mos 02/20/2013, 03/06/2014, 04/15/2016  . PPD Test 06/16/2016   Pertinent  Health Maintenance Due  Topic Date Due  . PNA vac Low Risk Adult (1 of 2 - PCV13) 06/01/1996  . INFLUENZA VACCINE  11/03/2016  . DEXA SCAN  Completed    Vitals:   07/08/16 1035  BP: (!) 130/31  Pulse: 80  Resp: 20  Temp: 99 F (37.2 C)  Weight: 260 lb 6.4 oz (118.1 kg)  Height:  (1.803 m)   Body mass index is 36.32 kg/m. Physical Exam  Constitutional: She is oriented to person, place, and time.  Morbid obese elderly in no acute distress   HENT:  Head: Normocephalic.  Mouth/Throat: Oropharynx is clear and moist. No oropharyngeal exudate.  Eyes: Conjunctivae and EOM are normal. Pupils are equal, round, and reactive to light. Right eye exhibits no discharge. Left eye exhibits no discharge. No scleral icterus.  Neck: Normal range of motion. No JVD present. No thyromegaly present.  Cardiovascular: Normal rate, regular rhythm, normal heart sounds and intact distal pulses.  Exam reveals no gallop and no friction rub.   No murmur heard. Pulmonary/Chest: Effort normal and breath sounds normal. No respiratory distress. She has no wheezes. She has no rales.  Abdominal: Soft. Bowel sounds are normal. She exhibits no distension. There is no tenderness. There is no rebound and no guarding.  Musculoskeletal: She exhibits no edema, tenderness or deformity.  Unsteady gait. Moves x 4 extremities.   Lymphadenopathy:    She has no cervical  adenopathy.  Neurological: She is oriented to person, place, and time.  Skin: Skin is warm and dry. No rash noted. No erythema. No pallor.  Psychiatric: She has a normal mood and affect.    Labs reviewed:  Recent Labs  06/01/16 1031 06/02/16 0134 06/03/16 0611  NA 140 140 139  K 3.2* 3.1* 3.5  CL 101 106 110  CO2 GLUCOSE 109* 114* 82  BUN CREATININE 0.87 0.76 0.69  CALCIUM 9.4 8.4* 8.0*    Recent Labs  06/01/16 1031  AST 99*  ALT 32  ALKPHOS 52  BILITOT 3.2*  PROT 7.5  ALBUMIN 3.3*    Recent Labs  06/01/16 1031 06/02/16 0134 06/03/16 0611  WBC 15.4* 11.8* 7.6  NEUTROABS 12.9*  --   --   HGB 14.5 12.1 12.3  HCT 42.4 36.1 37.3  MCV 96.1 96.5 98.2  PLT 271 236 233   No results found for: TSH Lab Results  Component Value Date   HGBA1C  08/07/2008    5.6 (NOTE) The ADA recommends the following therapeutic goal for  glycemic control related to Hgb A1c measurement: Goal of therapy: <6.5 Hgb A1c  Reference: American Diabetes Association: Clinical Practice Recommendations 2010, Diabetes Care, 2010, 33: (Suppl  1).    Assessment/Plan HTN B/p stable.continue on Hyzaar daily.on ASA for prophylaxis. Continue to monitor BMP.    Right knee pain  Pain worse after walking with therapy. Encouraged to take pain medication prior to therapy. Will consult PMR for pain evaluation.   Morbid obesity  Continue on heart health diet. Check TSH level, Hgb A1C and Lipid panel 07/09/2016.   Family/ staff Communication: Reviewed plan of care with patient and facility Nurse supervisor.   Labs/tests ordered: TSH level, Hgb A1C and Lipid panel 07/09/2016.

## 2016-07-09 DIAGNOSIS — M79609 Pain in unspecified limb: Secondary | ICD-10-CM | POA: Diagnosis not present

## 2016-07-09 LAB — LIPID PANEL
CHOLESTEROL: 149 mg/dL (ref 0–200)
HDL: 48 mg/dL (ref 35–70)
LDL Cholesterol: 74 mg/dL
TRIGLYCERIDES: 133 mg/dL (ref 40–160)

## 2016-07-09 LAB — HEMOGLOBIN A1C: Hemoglobin A1C: 5.4

## 2016-07-09 LAB — TSH: TSH: 2.46 u[IU]/mL (ref 0.41–5.90)

## 2016-07-12 DIAGNOSIS — M79609 Pain in unspecified limb: Secondary | ICD-10-CM | POA: Diagnosis not present

## 2016-07-14 DIAGNOSIS — M25561 Pain in right knee: Secondary | ICD-10-CM | POA: Diagnosis not present

## 2016-07-14 DIAGNOSIS — M79609 Pain in unspecified limb: Secondary | ICD-10-CM | POA: Diagnosis not present

## 2016-07-16 DIAGNOSIS — M79609 Pain in unspecified limb: Secondary | ICD-10-CM | POA: Diagnosis not present

## 2016-07-19 DIAGNOSIS — M79609 Pain in unspecified limb: Secondary | ICD-10-CM | POA: Diagnosis not present

## 2016-07-21 DIAGNOSIS — M79609 Pain in unspecified limb: Secondary | ICD-10-CM | POA: Diagnosis not present

## 2016-07-26 DIAGNOSIS — M159 Polyosteoarthritis, unspecified: Secondary | ICD-10-CM | POA: Diagnosis not present

## 2016-08-04 DIAGNOSIS — M159 Polyosteoarthritis, unspecified: Secondary | ICD-10-CM | POA: Diagnosis not present

## 2016-08-06 ENCOUNTER — Non-Acute Institutional Stay (SKILLED_NURSING_FACILITY): Payer: Medicare HMO | Admitting: Family

## 2016-08-06 ENCOUNTER — Encounter: Payer: Self-pay | Admitting: Family

## 2016-08-06 DIAGNOSIS — M159 Polyosteoarthritis, unspecified: Secondary | ICD-10-CM | POA: Diagnosis not present

## 2016-08-06 DIAGNOSIS — M1711 Unilateral primary osteoarthritis, right knee: Secondary | ICD-10-CM

## 2016-08-06 DIAGNOSIS — K5901 Slow transit constipation: Secondary | ICD-10-CM

## 2016-08-06 DIAGNOSIS — I10 Essential (primary) hypertension: Secondary | ICD-10-CM

## 2016-08-06 NOTE — Progress Notes (Signed)
Location:  [facility] Room Number: 801A Place of Service:  SNF (31) Provider:  Richarda Blade, NP  Gwynneth Aliment, MD  Patient Care Team: Dorothyann Peng, MD as PCP - General (Internal Medicine)  Extended Emergency Contact Information Primary Emergency Contact: CHILES,ROBERT SR Address: [address] [place]          [place]  [address] Home Phone: [phone] Mobile Phone: [phone] Relation: None  Code Status:  Full Code Goals of care: Advanced Directive information Advanced Directives 08/06/2016  Does Patient Have a Medical Advance Directive? No  Would patient like information on creating a medical advance directive? -     Chief Complaint  Patient presents with  . Medical Management of Chronic Issues    Routine Visit    HPI:  Pt is a 83 y.o. female seen today for medical management of chronic diseases.she has a medical history of HTN, OA, morbid obesity among other conditions.She is seen in her room today. She states has not been able to walking much with therapy due to right knee pain. PMR provider in to see patient steroid knee injection recommended but PMR specialist will contact patient's POA to discuss treatment option. Patient states anything that can help with pain. She has had no recent weight changes or fall episodes since prior visit. Facility Nurse reports no new concerns.  Past Medical History:  Diagnosis Date  . Dyspnea   . Hyperlipidemia   . Hypertension   . Morbid obesity (HCC)    Past Surgical History:  Procedure Laterality Date  . CARDIAC CATHETERIZATION  08/07/2008   Normal coronary arteries and LV function, mildly elevated R heart pressures-due to morbid obesity, continued on aggresive risk factor management  . CARDIOVASCULAR STRESS TEST  07/12/2008   Evidence of moderate ischemia seen in Basal Anterior, Mid Anterior, Apical Anterior, and Apical regions. EKG is negative for ischemia.  Marland Kitchen PARTIAL HYSTERECTOMY  1972  .  TRANSTHORACIC ECHOCARDIOGRAM  07/12/2008   EF 73%, mild AI, trace MR, PR, and TR    Allergies  Allergen Reactions  . Penicillins Other (See Comments)    Unknown childhood allergy    Allergies as of 08/06/2016      Reactions   Penicillins Other (See Comments)   Unknown childhood allergy      Medication List       Accurate as of 08/06/16  2:19 PM. Always use your most recent med list.          aspirin 81 MG chewable tablet Chew 81 mg by mouth daily.   BIOFREEZE 4 % Gel Generic drug:  Menthol (Topical Analgesic) Apply 1 application topically 3 (three) times daily as needed.   Cholecalciferol 1000 units capsule Take 1,000 Units by mouth daily.   ibuprofen 200 MG tablet Commonly known as:  ADVIL,MOTRIN Take 200 mg by mouth 2 (two) times daily with a meal.   ICY HOT EX Apply 1 application topically 3 (three) times daily.   losartan-hydrochlorothiazide 100-25 MG tablet Commonly known as:  HYZAAR Take 1 tablet by mouth daily.   multivitamin with minerals Tabs tablet Take 1 tablet by mouth daily.   potassium chloride 10 MEQ CR capsule Commonly known as:  MICRO-K Take 1 capsule by mouth every morning.   sennosides-docusate sodium 8.6-50 MG tablet Commonly known as:  SENOKOT-S Take 1 tablet by mouth 2 (two) times daily.   traMADol-acetaminophen 37.5-325 MG tablet Commonly known as:  ULTRACET Take 1-2 tablets by mouth every 6 (six)  hours as needed. Take 1 tablet for moderate pain (1-5) , take 2 tablets for severe pain (6-10)       Review of Systems  Constitutional: Negative for activity change, appetite change, chills, fatigue and fever.  HENT: Negative for congestion, rhinorrhea, sinus pain, sinus pressure, sneezing and sore throat.   Eyes: Negative.   Respiratory: Negative for cough, chest tightness, shortness of breath and wheezing.   Cardiovascular: Negative for chest pain, palpitations and leg swelling.  Gastrointestinal: Negative for abdominal distention,  abdominal pain, constipation, diarrhea and vomiting.  Endocrine: Negative.   Genitourinary: Negative for dysuria, flank pain, frequency and urgency.  Musculoskeletal: Positive for gait problem.        shoulder, right hip/knee pain   Skin: Negative for color change, pallor, rash and wound.  Neurological: Negative for dizziness, seizures, syncope, light-headedness and headaches.  Hematological: Does not bruise/bleed easily.  Psychiatric/Behavioral: Negative for agitation, confusion, hallucinations and sleep disturbance. The patient is not nervous/anxious.     Immunization History  Administered Date(s) Administered  . Influenza,inj,Quad PF,36+ Mos 02/20/2013, 03/06/2014, 04/15/2016  . PPD Test 06/16/2016   Pertinent  Health Maintenance Due  Topic Date Due  . PNA vac Low Risk Adult (1 of 2 - PCV13) 08/07/2017 (Originally 06/01/1996)  . INFLUENZA VACCINE  11/03/2016  . DEXA SCAN  Completed    Vitals:   08/06/16 0958  BP: 135/72  Pulse: 78  Resp: 19  Temp: 98.7 F (37.1 C)  SpO2: 96%  Weight: 260 lb (117.9 kg)  Height: 5\' 11"  (1.803 m)   Body mass index is 36.26 kg/m. Physical Exam  Constitutional: She is oriented to person, place, and time.   obese elderly in no acute distress   HENT:  Head: Normocephalic.  Mouth/Throat: Oropharynx is clear and moist. No oropharyngeal exudate.  Eyes: Conjunctivae and EOM are normal. Pupils are equal, round, and reactive to light. Right eye exhibits no discharge. Left eye exhibits no discharge. No scleral icterus.  Neck: Normal range of motion. No JVD present. No thyromegaly present.  Cardiovascular: Normal rate, regular rhythm, normal heart sounds and intact distal pulses.  Exam reveals no gallop and no friction rub.   No murmur heard. Pulmonary/Chest: Effort normal and breath sounds normal. No respiratory distress. She has no wheezes. She has no rales.  Abdominal: Soft. Bowel sounds are normal. She exhibits no distension. There is no  tenderness. There is no rebound and no guarding.  Musculoskeletal: She exhibits no edema, tenderness or deformity.  Unsteady gait. Moves x 4 extremities.   Lymphadenopathy:    She has no cervical adenopathy.  Neurological: She is oriented to person, place, and time.  Skin: Skin is warm and dry. No rash noted. No erythema. No pallor.  Psychiatric: She has a normal mood and affect.    Labs reviewed:  Recent Labs  06/01/16 1031 06/02/16 0134 06/03/16 0611  NA 140 140 139  K 3.2* 3.1* 3.5  CL 101 106 110  CO2 25 27 24   GLUCOSE 109* 114* 82  BUN 20 20 16   CREATININE 0.87 0.76 0.69  CALCIUM 9.4 8.4* 8.0*    Recent Labs  06/01/16 1031  AST 99*  ALT 32  ALKPHOS 52  BILITOT 3.2*  PROT 7.5  ALBUMIN 3.3*    Recent Labs  06/01/16 1031 06/02/16 0134 06/03/16 0611  WBC 15.4* 11.8* 7.6  NEUTROABS 12.9*  --   --   HGB 14.5 12.1 12.3  HCT 42.4 36.1 37.3  MCV 96.1 96.5 98.2  PLT 271 236 233   Lab Results  Component Value Date   TSH 2.46 07/09/2016   Lab Results  Component Value Date   HGBA1C 5.4 07/09/2016    Assessment/Plan Osteoarthritis  Multiple sites on shoulders but worst on right hip and knee. PMR specialist discussed steroids injection to right knee with patient.Patient in agreement with plan awaiting POA approval.continue current pain regimen. Continue to follow up with PMR for pain management. Fall and safety precautions.  HTN B/p stable.continue on Hyzaar daily.on ASA for prophylaxis. Continue to monitor BMP.  Constipation Current regimen effective. Continue to encourage hydration.    Family/ staff Communication: Reviewed plan of care with patient and facility Nurse supervisor.   Labs/tests ordered: None

## 2016-08-09 DIAGNOSIS — M1711 Unilateral primary osteoarthritis, right knee: Secondary | ICD-10-CM | POA: Diagnosis not present

## 2016-08-11 DIAGNOSIS — M159 Polyosteoarthritis, unspecified: Secondary | ICD-10-CM | POA: Diagnosis not present

## 2016-08-26 ENCOUNTER — Non-Acute Institutional Stay (SKILLED_NURSING_FACILITY): Payer: Medicare HMO

## 2016-08-26 DIAGNOSIS — M6282 Rhabdomyolysis: Secondary | ICD-10-CM | POA: Diagnosis not present

## 2016-08-26 DIAGNOSIS — Z0000 Encounter for general adult medical examination without abnormal findings: Secondary | ICD-10-CM | POA: Diagnosis not present

## 2016-08-26 NOTE — Progress Notes (Signed)
Subjective:   [facility] is a 83 y.o. female who presents for Medicare Annual (Subsequent) preventive examination at [facility]     Objective:     Vitals: BP 112/70 (BP Location: Left Arm, Patient Position: Sitting)   Pulse 64   Temp 97.2 F (36.2 C) (Oral)   Ht 5\' 11"  (1.803 m)   Wt 260 lb (117.9 kg)   SpO2 98%   BMI 36.26 kg/m   Body mass index is 36.26 kg/m.   Tobacco History  Smoking Status  . Never Smoker  Smokeless Tobacco  . Never Used     Counseling given: Not Answered   Past Medical History:  Diagnosis Date  . Dyspnea   . Hyperlipidemia   . Hypertension   . Morbid obesity (HCC)    Past Surgical History:  Procedure Laterality Date  . CARDIAC CATHETERIZATION  08/07/2008   Normal coronary arteries and LV function, mildly elevated R heart pressures-due to morbid obesity, continued on aggresive risk factor management  . CARDIOVASCULAR STRESS TEST  07/12/2008   Evidence of moderate ischemia seen in Basal Anterior, Mid Anterior, Apical Anterior, and Apical regions. EKG is negative for ischemia.  Marland Kitchen PARTIAL HYSTERECTOMY  1972  . TRANSTHORACIC ECHOCARDIOGRAM  07/12/2008   EF 73%, mild AI, trace MR, PR, and TR   Family History  Problem Relation Age of Onset  . Cancer Mother   . Heart attack Father   . Heart failure Maternal Grandmother   . Heart failure Maternal Grandfather    History  Sexual Activity  . Sexual activity: Not on file    Outpatient Encounter Prescriptions as of 08/26/2016  Medication Sig  . aspirin 81 MG chewable tablet Chew 81 mg by mouth daily.  . Cholecalciferol 1000 UNITS capsule Take 1,000 Units by mouth daily.  Marland Kitchen ibuprofen (ADVIL,MOTRIN) 200 MG tablet Take 200 mg by mouth 2 (two) times daily with a meal.  . losartan-hydrochlorothiazide (HYZAAR) 100-25 MG tablet Take 1 tablet by mouth daily.   . Menthol, Topical Analgesic, (BIOFREEZE) 4 % GEL Apply 1 application topically 3 (three) times daily as needed.  .  Menthol, Topical Analgesic, (ICY HOT EX) Apply 1 application topically 3 (three) times daily.  . Multiple Vitamin (MULTIVITAMIN WITH MINERALS) TABS tablet Take 1 tablet by mouth daily.  . potassium chloride (MICRO-K) 10 MEQ CR capsule Take 1 capsule by mouth every morning.   . sennosides-docusate sodium (SENOKOT-S) 8.6-50 MG tablet Take 1 tablet by mouth 2 (two) times daily.  . traMADol-acetaminophen (ULTRACET) 37.5-325 MG tablet Take 1-2 tablets by mouth every 6 (six) hours as needed. Take 1 tablet for moderate pain (1-5) , take 2 tablets for severe pain (6-10)   No facility-administered encounter medications on file as of 08/26/2016.     Activities of Daily Living In your present state of health, do you have any difficulty performing the following activities: 08/26/2016 06/01/2016  Hearing? N -  Vision? N -  Difficulty concentrating or making decisions? N -  Walking or climbing stairs? Y -  Dressing or bathing? N -  Doing errands, shopping? Y N  Preparing Food and eating ? Y -  Using the Toilet? Y -  In the past six months, have you accidently leaked urine? N -  Do you have problems with loss of bowel control? N -  Managing your Medications? Y -  Managing your Finances? Y -  Housekeeping or managing your Housekeeping? Y -  Some recent data might  be hidden    Patient Care Team: Dorothyann PengSanders, Robyn, MD as PCP - General (Internal Medicine)    Assessment:    Exercise Activities and Dietary recommendations Current Exercise Habits: The patient does not participate in regular exercise at present, Exercise limited by: orthopedic condition(s)  Goals    . Maintain Lifestyle          Starting today pt will maintain lifestyle.       Fall Risk Fall Risk  08/26/2016  Falls in the past year? Yes  Number falls in past yr: 1  Injury with Fall? Yes   Depression Screen PHQ 2/9 Scores 08/26/2016  PHQ - 2 Score 0     Cognitive Function     6CIT Screen 08/26/2016  What Year? 0 points    What month? 3 points  What time? 0 points  Count back from 20 0 points  Months in reverse 4 points  Repeat phrase 2 points  Total Score 9    Immunization History  Administered Date(s) Administered  . Influenza,inj,Quad PF,36+ Mos 02/20/2013, 03/06/2014, 04/15/2016  . PPD Test 06/16/2016   Screening Tests Health Maintenance  Topic Date Due  . PNA vac Low Risk Adult (1 of 2 - PCV13) 08/07/2017 (Originally 06/01/1996)  . TETANUS/TDAP  04/06/2023 (Originally 06/01/1948)  . INFLUENZA VACCINE  11/03/2016  . DEXA SCAN  Completed      Plan:    I have personally reviewed and addressed the Medicare Annual Wellness questionnaire and have noted the following in the patient's chart:  A. Medical and social history B. Use of alcohol, tobacco or illicit drugs  C. Current medications and supplements D. Functional ability and status E.  Nutritional status F.  Physical activity G. Advance directives H. List of other physicians I.  Hospitalizations, surgeries, and ER visits in previous 12 months J.  Vitals K. Screenings to include hearing, vision, cognitive, depression L. Referrals and appointments - none  In addition, I have reviewed and discussed with patient certain preventive protocols, quality metrics, and best practice recommendations. A written personalized care plan for preventive services as well as general preventive health recommendations were provided to patient.  See attached scanned questionnaire for additional information.   Signed,   Annetta MawSara Gonthier, RN Nurse Health Advisor

## 2016-08-26 NOTE — Patient Instructions (Signed)
[name] , Thank you for taking time to come for your Medicare Wellness Visit. I appreciate your ongoing commitment to your health goals. Please review the following plan we discussed and let me know if I can assist you in the future.   Screening recommendations/referrals: Colonoscopy up to date, pt over age 81 Mammogram up to date, pt over age 81 Bone Density up to date Recommended yearly ophthalmology/optometry visit for glaucoma screening and checkup Recommended yearly dental visit for hygiene and checkup  Vaccinations: Influenza vaccine up to date. Due 05/25/1027 Pneumococcal vaccine 13 ordered. Tdap vaccine due 12/12/2022 Shingles vaccine not in records  Advanced directives: Need a copy for the chart  Conditions/risks identified: none  Next appointment: none upcoming   Preventive Care 65 Years and Older, Female Preventive care refers to lifestyle choices and visits with your health care provider that can promote health and wellness. What does preventive care include?  A yearly physical exam. This is also called an annual well check.  Dental exams once or twice a year.  Routine eye exams. Ask your health care provider how often you should have your eyes checked.  Personal lifestyle choices, including:  Daily care of your teeth and gums.  Regular physical activity.  Eating a healthy diet.  Avoiding tobacco and drug use.  Limiting alcohol use.  Practicing safe sex.  Taking low-dose aspirin every day.  Taking vitamin and mineral supplements as recommended by your health care provider. What happens during an annual well check? The services and screenings done by your health care provider during your annual well check will depend on your age, overall health, lifestyle risk factors, and family history of disease. Counseling  Your health care provider may ask you questions about your:  Alcohol use.  Tobacco use.  Drug use.  Emotional well-being.  Home and  relationship well-being.  Sexual activity.  Eating habits.  History of falls.  Memory and ability to understand (cognition).  Work and work Astronomerenvironment.  Reproductive health. Screening  You may have the following tests or measurements:  Height, weight, and BMI.  Blood pressure.  Lipid and cholesterol levels. These may be checked every 5 years, or more frequently if you are over 81 years old.  Skin check.  Lung cancer screening. You may have this screening every year starting at age 81 if you have a 30-pack-year history of smoking and currently smoke or have quit within the past 15 years.  Fecal occult blood test (FOBT) of the stool. You may have this test every year starting at age 81.  Flexible sigmoidoscopy or colonoscopy. You may have a sigmoidoscopy every 5 years or a colonoscopy every 10 years starting at age 81.  Hepatitis C blood test.  Hepatitis B blood test.  Sexually transmitted disease (STD) testing.  Diabetes screening. This is done by checking your blood sugar (glucose) after you have not eaten for a while (fasting). You may have this done every 1-3 years.  Bone density scan. This is done to screen for osteoporosis. You may have this done starting at age 81.  Mammogram. This may be done every 1-2 years. Talk to your health care provider about how often you should have regular mammograms. Talk with your health care provider about your test results, treatment options, and if necessary, the need for more tests. Vaccines  Your health care provider may recommend certain vaccines, such as:  Influenza vaccine. This is recommended every year.  Tetanus, diphtheria, and acellular pertussis (Tdap, Td) vaccine.  You may need a Td booster every 10 years.  Zoster vaccine. You may need this after age 24.  Pneumococcal 13-valent conjugate (PCV13) vaccine. One dose is recommended after age 42.  Pneumococcal polysaccharide (PPSV23) vaccine. One dose is recommended after  age 68. Talk to your health care provider about which screenings and vaccines you need and how often you need them. This information is not intended to replace advice given to you by your health care provider. Make sure you discuss any questions you have with your health care provider. Document Released: 04/18/2015 Document Revised: 12/10/2015 Document Reviewed: 01/21/2015 Elsevier Interactive Patient Education  2017 [place] Prevention in the Home Falls can cause injuries. They can happen to people of all ages. There are many things you can do to make your home safe and to help prevent falls. What can I do on the outside of my home?  Regularly fix the edges of walkways and driveways and fix any cracks.  Remove anything that might make you trip as you walk through a door, such as a raised step or threshold.  Trim any bushes or trees on the path to your home.  Use bright outdoor lighting.  Clear any walking paths of anything that might make someone trip, such as rocks or tools.  Regularly check to see if handrails are loose or broken. Make sure that both sides of any steps have handrails.  Any raised decks and porches should have guardrails on the edges.  Have any leaves, snow, or ice cleared regularly.  Use sand or salt on walking paths during winter.  Clean up any spills in your garage right away. This includes oil or grease spills. What can I do in the bathroom?  Use night lights.  Install grab bars by the toilet and in the tub and shower. Do not use towel bars as grab bars.  Use non-skid mats or decals in the tub or shower.  If you need to sit down in the shower, use a plastic, non-slip stool.  Keep the floor dry. Clean up any water that spills on the floor as soon as it happens.  Remove soap buildup in the tub or shower regularly.  Attach bath mats securely with double-sided non-slip rug tape.  Do not have throw rugs and other things on the floor that can  make you trip. What can I do in the bedroom?  Use night lights.  Make sure that you have a light by your bed that is easy to reach.  Do not use any sheets or blankets that are too big for your bed. They should not hang down onto the floor.  Have a firm chair that has side arms. You can use this for support while you get dressed.  Do not have throw rugs and other things on the floor that can make you trip. What can I do in the kitchen?  Clean up any spills right away.  Avoid walking on wet floors.  Keep items that you use a lot in easy-to-reach places.  If you need to reach something above you, use a strong step stool that has a grab bar.  Keep electrical cords out of the way.  Do not use floor polish or wax that makes floors slippery. If you must use wax, use non-skid floor wax.  Do not have throw rugs and other things on the floor that can make you trip. What can I do with my stairs?  Do not leave any  items on the stairs.  Make sure that there are handrails on both sides of the stairs and use them. Fix handrails that are broken or loose. Make sure that handrails are as long as the stairways.  Check any carpeting to make sure that it is firmly attached to the stairs. Fix any carpet that is loose or worn.  Avoid having throw rugs at the top or bottom of the stairs. If you do have throw rugs, attach them to the floor with carpet tape.  Make sure that you have a light switch at the top of the stairs and the bottom of the stairs. If you do not have them, ask someone to add them for you. What else can I do to help prevent falls?  Wear shoes that:  Do not have high heels.  Have rubber bottoms.  Are comfortable and fit you well.  Are closed at the toe. Do not wear sandals.  If you use a stepladder:  Make sure that it is fully opened. Do not climb a closed stepladder.  Make sure that both sides of the stepladder are locked into place.  Ask someone to hold it for you,  if possible.  Clearly mark and make sure that you can see:  Any grab bars or handrails.  First and last steps.  Where the edge of each step is.  Use tools that help you move around (mobility aids) if they are needed. These include:  Canes.  Walkers.  Scooters.  Crutches.  Turn on the lights when you go into a dark area. Replace any light bulbs as soon as they burn out.  Set up your furniture so you have a clear path. Avoid moving your furniture around.  If any of your floors are uneven, fix them.  If there are any pets around you, be aware of where they are.  Review your medicines with your doctor. Some medicines can make you feel dizzy. This can increase your chance of falling. Ask your doctor what other things that you can do to help prevent falls. This information is not intended to replace advice given to you by your health care provider. Make sure you discuss any questions you have with your health care provider. Document Released: 01/16/2009 Document Revised: 08/28/2015 Document Reviewed: 04/26/2014 Elsevier Interactive Patient Education  2017 [employer].

## 2016-08-26 NOTE — Progress Notes (Signed)
Quick Notes   Health Maintenance: PN13 ordered, pt agreed.      Abnormal Screen: 6 CIT-9     Patient Concerns: None     Nurse Concerns: None

## 2016-08-27 DIAGNOSIS — M159 Polyosteoarthritis, unspecified: Secondary | ICD-10-CM | POA: Diagnosis not present

## 2016-08-27 DIAGNOSIS — M6282 Rhabdomyolysis: Secondary | ICD-10-CM | POA: Diagnosis not present

## 2016-08-30 DIAGNOSIS — M6282 Rhabdomyolysis: Secondary | ICD-10-CM | POA: Diagnosis not present

## 2016-08-31 DIAGNOSIS — M6282 Rhabdomyolysis: Secondary | ICD-10-CM | POA: Diagnosis not present

## 2016-09-01 DIAGNOSIS — M6282 Rhabdomyolysis: Secondary | ICD-10-CM | POA: Diagnosis not present

## 2016-09-02 DIAGNOSIS — M6282 Rhabdomyolysis: Secondary | ICD-10-CM | POA: Diagnosis not present

## 2016-09-03 DIAGNOSIS — M6281 Muscle weakness (generalized): Secondary | ICD-10-CM | POA: Diagnosis not present

## 2016-09-06 DIAGNOSIS — M6281 Muscle weakness (generalized): Secondary | ICD-10-CM | POA: Diagnosis not present

## 2016-09-07 DIAGNOSIS — M6281 Muscle weakness (generalized): Secondary | ICD-10-CM | POA: Diagnosis not present

## 2016-09-07 DIAGNOSIS — S46012D Strain of muscle(s) and tendon(s) of the rotator cuff of left shoulder, subsequent encounter: Secondary | ICD-10-CM | POA: Diagnosis not present

## 2016-09-08 DIAGNOSIS — M6281 Muscle weakness (generalized): Secondary | ICD-10-CM | POA: Diagnosis not present

## 2016-09-10 DIAGNOSIS — M130 Polyarthritis, unspecified: Secondary | ICD-10-CM | POA: Diagnosis not present

## 2016-09-10 DIAGNOSIS — M6281 Muscle weakness (generalized): Secondary | ICD-10-CM | POA: Diagnosis not present

## 2016-09-11 DIAGNOSIS — M6281 Muscle weakness (generalized): Secondary | ICD-10-CM | POA: Diagnosis not present

## 2016-09-13 DIAGNOSIS — M6281 Muscle weakness (generalized): Secondary | ICD-10-CM | POA: Diagnosis not present

## 2016-09-14 DIAGNOSIS — M6281 Muscle weakness (generalized): Secondary | ICD-10-CM | POA: Diagnosis not present

## 2016-09-17 DIAGNOSIS — M6281 Muscle weakness (generalized): Secondary | ICD-10-CM | POA: Diagnosis not present

## 2016-09-20 DIAGNOSIS — M6281 Muscle weakness (generalized): Secondary | ICD-10-CM | POA: Diagnosis not present

## 2016-09-21 DIAGNOSIS — M6281 Muscle weakness (generalized): Secondary | ICD-10-CM | POA: Diagnosis not present

## 2016-09-22 DIAGNOSIS — M6281 Muscle weakness (generalized): Secondary | ICD-10-CM | POA: Diagnosis not present

## 2016-09-23 DIAGNOSIS — M6281 Muscle weakness (generalized): Secondary | ICD-10-CM | POA: Diagnosis not present

## 2016-09-24 DIAGNOSIS — M130 Polyarthritis, unspecified: Secondary | ICD-10-CM | POA: Diagnosis not present

## 2016-09-24 DIAGNOSIS — M6281 Muscle weakness (generalized): Secondary | ICD-10-CM | POA: Diagnosis not present

## 2016-09-27 DIAGNOSIS — M6281 Muscle weakness (generalized): Secondary | ICD-10-CM | POA: Diagnosis not present

## 2016-09-28 DIAGNOSIS — M6281 Muscle weakness (generalized): Secondary | ICD-10-CM | POA: Diagnosis not present

## 2016-09-29 DIAGNOSIS — M6281 Muscle weakness (generalized): Secondary | ICD-10-CM | POA: Diagnosis not present

## 2016-09-30 DIAGNOSIS — M6281 Muscle weakness (generalized): Secondary | ICD-10-CM | POA: Diagnosis not present

## 2016-10-01 DIAGNOSIS — M6281 Muscle weakness (generalized): Secondary | ICD-10-CM | POA: Diagnosis not present

## 2016-10-04 DIAGNOSIS — M6281 Muscle weakness (generalized): Secondary | ICD-10-CM | POA: Diagnosis not present

## 2016-10-06 DIAGNOSIS — F329 Major depressive disorder, single episode, unspecified: Secondary | ICD-10-CM | POA: Diagnosis not present

## 2016-10-25 DIAGNOSIS — M130 Polyarthritis, unspecified: Secondary | ICD-10-CM | POA: Diagnosis not present

## 2016-11-01 DIAGNOSIS — Z9189 Other specified personal risk factors, not elsewhere classified: Secondary | ICD-10-CM | POA: Diagnosis not present

## 2016-11-01 DIAGNOSIS — M1990 Unspecified osteoarthritis, unspecified site: Secondary | ICD-10-CM | POA: Diagnosis not present

## 2016-11-01 DIAGNOSIS — Z9181 History of falling: Secondary | ICD-10-CM | POA: Diagnosis not present

## 2016-11-01 DIAGNOSIS — R52 Pain, unspecified: Secondary | ICD-10-CM | POA: Diagnosis not present

## 2016-11-05 DIAGNOSIS — Z7409 Other reduced mobility: Secondary | ICD-10-CM | POA: Diagnosis not present

## 2016-11-05 DIAGNOSIS — M1990 Unspecified osteoarthritis, unspecified site: Secondary | ICD-10-CM | POA: Diagnosis not present

## 2016-11-05 DIAGNOSIS — I10 Essential (primary) hypertension: Secondary | ICD-10-CM | POA: Diagnosis not present

## 2016-11-05 DIAGNOSIS — R3981 Functional urinary incontinence: Secondary | ICD-10-CM | POA: Diagnosis not present

## 2016-11-12 DIAGNOSIS — M6282 Rhabdomyolysis: Secondary | ICD-10-CM | POA: Diagnosis not present

## 2016-11-24 DIAGNOSIS — M130 Polyarthritis, unspecified: Secondary | ICD-10-CM | POA: Diagnosis not present

## 2016-12-01 DIAGNOSIS — I10 Essential (primary) hypertension: Secondary | ICD-10-CM | POA: Diagnosis not present

## 2016-12-01 DIAGNOSIS — R52 Pain, unspecified: Secondary | ICD-10-CM | POA: Diagnosis not present

## 2016-12-01 DIAGNOSIS — Z7409 Other reduced mobility: Secondary | ICD-10-CM | POA: Diagnosis not present

## 2016-12-01 DIAGNOSIS — I2510 Atherosclerotic heart disease of native coronary artery without angina pectoris: Secondary | ICD-10-CM | POA: Diagnosis not present

## 2017-06-07 ENCOUNTER — Ambulatory Visit (INDEPENDENT_AMBULATORY_CARE_PROVIDER_SITE_OTHER): Payer: Medicare Other

## 2017-06-07 ENCOUNTER — Ambulatory Visit (INDEPENDENT_AMBULATORY_CARE_PROVIDER_SITE_OTHER): Payer: Medicare Other | Admitting: Orthopaedic Surgery

## 2017-06-07 ENCOUNTER — Encounter (INDEPENDENT_AMBULATORY_CARE_PROVIDER_SITE_OTHER): Payer: Self-pay | Admitting: Orthopaedic Surgery

## 2017-06-07 DIAGNOSIS — M25512 Pain in left shoulder: Secondary | ICD-10-CM

## 2017-06-07 DIAGNOSIS — M19019 Primary osteoarthritis, unspecified shoulder: Secondary | ICD-10-CM | POA: Diagnosis not present

## 2017-06-07 DIAGNOSIS — M25561 Pain in right knee: Secondary | ICD-10-CM

## 2017-06-07 DIAGNOSIS — M1711 Unilateral primary osteoarthritis, right knee: Secondary | ICD-10-CM | POA: Diagnosis not present

## 2017-06-07 DIAGNOSIS — G8929 Other chronic pain: Secondary | ICD-10-CM

## 2017-06-07 MED ORDER — LIDOCAINE HCL 1 % IJ SOLN
3.0000 mL | INTRAMUSCULAR | Status: AC | PRN
  Administered 2017-06-07: 3 mL

## 2017-06-07 MED ORDER — METHYLPREDNISOLONE ACETATE 40 MG/ML IJ SUSP
40.0000 mg | INTRAMUSCULAR | Status: AC | PRN
  Administered 2017-06-07: 40 mg via INTRA_ARTICULAR

## 2017-06-07 NOTE — Progress Notes (Signed)
Office Visit Note   Patient: Brianna David           Date of Birth: February 04, 1930           MRN: [id number] Visit Date: 06/07/2017              Requested by: Dorothyann Peng, MD [address] [place], [place] [address] PCP: Dorothyann Peng, MD   Assessment & Plan: Visit Diagnoses:  1. Chronic pain of right knee   2. Chronic left shoulder pain   3. Unilateral primary osteoarthritis, right knee   4. Arthritis of shoulder     Plan: Given the combination of her age, her multiple comorbidities and her obesity she is not a candidate for any type of joint replacement surgery.  I did offer steroid injections in her left shoulder and her right knee which she and her brother agreed upon.  I provided these injections without difficulties.  She knows to wait at least 3-4 months between injections.  All questions and concerns were answered and addressed.  Follow-up will be otherwise as needed.  Follow-Up Instructions: Return if symptoms worsen or fail to improve.   Orders:  Orders Placed This Encounter  Procedures  . Large Joint Inj  . Large Joint Inj  . XR Knee 1-2 Views Right  . XR Shoulder Left   No orders of the defined types were placed in this encounter.     Procedures: Large Joint Inj: R knee on 06/07/2017 11:27 AM Indications: diagnostic evaluation and pain Details: 22 G 1.5 in needle, superolateral approach  Arthrogram: No  Medications: 3 mL lidocaine 1 %; 40 mg methylPREDNISolone acetate 40 MG/ML Outcome: tolerated well, no immediate complications Procedure, treatment alternatives, risks and benefits explained, specific risks discussed. Consent was given by the patient. Immediately prior to procedure a time out was called to verify the correct patient, procedure, equipment, support staff and site/side marked as required. Patient was prepped and draped in the usual sterile fashion.   Large Joint Inj: L subacromial bursa on 06/07/2017 11:27 AM Indications: pain and  diagnostic evaluation Details: 22 G 1.5 in needle  Arthrogram: No  Medications: 3 mL lidocaine 1 %; 40 mg methylPREDNISolone acetate 40 MG/ML Outcome: tolerated well, no immediate complications Procedure, treatment alternatives, risks and benefits explained, specific risks discussed. Consent was given by the patient. Immediately prior to procedure a time out was called to verify the correct patient, procedure, equipment, support staff and site/side marked as required. Patient was prepped and draped in the usual sterile fashion.       Clinical Data: No additional findings.   Subjective: Chief Complaint  Patient presents with  . Right Knee - Pain  . Left Shoulder - Pain  Patient is a very pleasant 84 year old female who stays at [facility] skilled nursing facility.  She has multiple medical issues and is morbidly obese.  She is been dealing with chronic left shoulder pain and chronic right knee pain for some time now.  Apparently she has had injections in the past but may have been a while.  Her brother is with her today.  She says that she would just like to be able to walk better.  She is had multiple falls over time according to her and her mother.  HPI  Review of Systems She currently denies any headache, chest pain, shortness of breath, fever, chills, nausea, vomiting.  Objective: Vital Signs: There were no vitals taken for this visit.  Physical Exam She  is alert and oriented and follows commands.  She is morbidly obese. Ortho Exam Examination of her left shoulder shows significant limitations with abduction and external rotation.  There is severe grinding at the glenohumeral joint and essentially severe weakness of the rotator cuff.  Examination of her bilateral lower extremities shows pitting edema in her legs.  Her right knee shows just a mild effusion with varus malalignment and painful range of motion that is almost full but painful.  Both legs are significantly  weak. Specialty Comments:  No specialty comments available.  Imaging: Xr Knee 1-2 Views Right  Result Date: 06/07/2017 2 views of the right knee show severe tricompartmental arthritic changes.  Xr Shoulder Left  Result Date: 06/07/2017 3 views of the left shoulder shows severe end-stage glenohumeral arthritic changes.    PMFS History: Patient Active Problem List   Diagnosis Date Noted  . Pressure injury of skin 06/02/2016  . Rhabdomyolysis 06/01/2016  . Falls 06/01/2016  . Hematuria 06/01/2016  . Chronic pain 06/01/2016  . Physical deconditioning   . Encounter for immunization 04/15/2016  . HTN (hypertension) 02/20/2013  . Morbid obesity (HCC) 02/20/2013  . DOE (dyspnea on exertion) 02/20/2013   Past Medical History:  Diagnosis Date  . Dyspnea   . Hyperlipidemia   . Hypertension   . Morbid obesity (HCC)     Family History  Problem Relation Age of Onset  . Cancer Mother   . Heart attack Father   . Heart failure Maternal Grandmother   . Heart failure Maternal Grandfather     Past Surgical History:  Procedure Laterality Date  . CARDIAC CATHETERIZATION  08/07/2008   Normal coronary arteries and LV function, mildly elevated R heart pressures-due to morbid obesity, continued on aggresive risk factor management  . CARDIOVASCULAR STRESS TEST  07/12/2008   Evidence of moderate ischemia seen in Basal Anterior, Mid Anterior, Apical Anterior, and Apical regions. EKG is negative for ischemia.  Marland Kitchen. PARTIAL HYSTERECTOMY  1972  . TRANSTHORACIC ECHOCARDIOGRAM  07/12/2008   EF 73%, mild AI, trace MR, PR, and TR   Social History   Occupational History  . Not on file  Tobacco Use  . Smoking status: Never Smoker  . Smokeless tobacco: Never Used  Substance and Sexual Activity  . Alcohol use: No  . Drug use: No  . Sexual activity: Not on file

## 2017-07-04 NOTE — Progress Notes (Signed)
Cardiology Office Note   Date:  07/05/2017   ID:  Brianna David, DOB 03/30/1930, MRN [id number]  PCP:  Dorothyann Peng, MD  Cardiologist:  [facility]    Chief Complaint  Patient presents with  . Follow-up   History of Present Illness: Brianna David is a 84 y.o. female who presents for ongoing assessment of HTN, h/o dyspnea, and dyslipidemia, with other history of obesity. She has chronic knee pain. She was stable from cardiac standpoint and was not having recurrence of dyspnea.  Most recent echocardiogram revealed an ejection fraction of 70%.  Most recent myocardial perfusion study was negative for ischemia, 07/12/2008.  She is here for annual appointment.She is here for annual appointment.   She states that she is not given fried chicken and other foods that she likes at the skilled nursing facility.  The patient is undergoing physical rehab due to fall and injury to her right leg.  The patient remains wheelchair-bound but does get up and use a walker at the facility.  She denies chest pain, dyspnea, headaches, blurred vision, or dizziness.  Past Medical History:  Diagnosis Date  . Dyspnea   . Hyperlipidemia   . Hypertension   . Morbid obesity (HCC)     Past Surgical History:  Procedure Laterality Date  . CARDIAC CATHETERIZATION  08/07/2008   Normal coronary arteries and LV function, mildly elevated R heart pressures-due to morbid obesity, continued on aggresive risk factor management  . CARDIOVASCULAR STRESS TEST  07/12/2008   Evidence of moderate ischemia seen in Basal Anterior, Mid Anterior, Apical Anterior, and Apical regions. EKG is negative for ischemia.  Marland Kitchen PARTIAL HYSTERECTOMY  1972  . TRANSTHORACIC ECHOCARDIOGRAM  07/12/2008   EF 73%, mild AI, trace MR, PR, and TR     Current Outpatient Medications  Medication Sig Dispense Refill  . aspirin 81 MG chewable tablet Chew 81 mg by mouth daily.    . Cholecalciferol 1000 UNITS capsule Take 2,000 Units by mouth daily.     .  cloNIDine (CATAPRES) 0.1 MG tablet Take 0.1 mg by mouth as directed.    Marland Kitchen HYDROcodone-acetaminophen (NORCO/VICODIN) 5-325 MG tablet Take 1 tablet by mouth every 6 (six) hours as needed for moderate pain.    Marland Kitchen ibuprofen (ADVIL,MOTRIN) 200 MG tablet Take 200 mg by mouth 2 (two) times daily with a meal.    . losartan-hydrochlorothiazide (HYZAAR) 100-25 MG tablet Take 1 tablet by mouth daily.     . Menthol, Topical Analgesic, (BIOFREEZE) 4 % GEL Apply 1 application topically 3 (three) times daily as needed.    . Menthol, Topical Analgesic, (ICY HOT EX) Apply 1 application topically 3 (three) times daily.    . Multiple Vitamin (MULTIVITAMIN WITH MINERALS) TABS tablet Take 1 tablet by mouth daily.    . potassium chloride (MICRO-K) 10 MEQ CR capsule Take 1 capsule by mouth every morning.     . sennosides-docusate sodium (SENOKOT-S) 8.6-50 MG tablet Take 1 tablet by mouth 2 (two) times daily.     No current facility-administered medications for this visit.     Allergies:   Penicillins    Social History:  The patient  reports that she has never smoked. She has never used smokeless tobacco. She reports that she does not drink alcohol or use drugs.   Family History:  The patient's family history includes Cancer in her mother; Heart attack in her father; Heart failure in her maternal grandfather and maternal grandmother.    ROS: All other systems  are reviewed and negative. Unless otherwise mentioned in H&P   PHYSICAL EXAM: VS:  BP 138/78   Pulse 72   Ht 5\' 11"  (1.803 m)   BMI 36.26 kg/m  , BMI Body mass index is 36.26 kg/m. GEN: Well nourished, well developed, in no acute distress morbidly obese, sitting in a wheelchair. HEENT: normal  Neck: no JVD, carotid bruits, or masses Cardiac: RRR; no murmurs, rubs, or gallops,no edema  Respiratory:  Clear to auscultation bilaterally, normal work of breathing GI: soft, nontender, nondistended, + BS MS: no deformity or atrophy brace to back and right  upper leg to the knee.  Bilateral 2+ pitting edema in the dependent position. Skin: warm and dry, no rash Neuro:  Strength and sensation are intact Psych: euthymic mood, full affect  EKG: Normal sinus rhythm  Recent Labs: 07/09/2016: TSH 2.46    Lipid Panel    Component Value Date/Time   CHOL 149 07/09/2016   TRIG 133 07/09/2016   HDL 48 07/09/2016   LDLCALC 74 07/09/2016      Wt Readings from Last 3 Encounters:  08/26/16 260 lb (117.9 kg)  08/06/16 260 lb (117.9 kg)  07/08/16 260 lb 6.4 oz (118.1 kg)      Other studies Reviewed: Please review scanned document for   ASSESSMENT AND PLAN:  1.  Hypertension: Blood pressure well controlled currently.  The patient is angry that she is on a low-sodium diet and is resisting.  She would like to eat which she wants to eat at the skilled nursing facility.  [facility] at [facility].  Advised her on a low-sodium diet so that this will help keep her blood pressure under control and her fluid retention at bay.   2.  Chronic dyspnea: Patient is very inactive, she is mostly sedentary in a wheelchair, she is undergoing physical therapy.  She is basically asymptomatic this may be related to significant deconditioning related also to morbid obesity.   3. Chronic LEE: I have advised knee-high support hose to assist with dependent edema.   Current medicines are reviewed at length with the patient today.    Labs/ tests ordered today include: None  Bettey MareKathryn M. Liborio NixonLawrence DNP, ANP, AACC   07/05/2017 11:04 AM    [facility] [address], [place], [place] [address] Phone: [phone]; Fax: [phone]) [phone]

## 2017-07-05 ENCOUNTER — Encounter: Payer: Self-pay | Admitting: Adult Health

## 2017-07-05 ENCOUNTER — Ambulatory Visit (INDEPENDENT_AMBULATORY_CARE_PROVIDER_SITE_OTHER): Payer: Medicare Other | Admitting: Adult Health

## 2017-07-05 VITALS — BP 138/78 | HR 72 | Ht 71.0 in

## 2017-07-05 DIAGNOSIS — I25119 Atherosclerotic heart disease of native coronary artery with unspecified angina pectoris: Secondary | ICD-10-CM | POA: Diagnosis not present

## 2017-07-05 DIAGNOSIS — R609 Edema, unspecified: Secondary | ICD-10-CM

## 2017-07-05 DIAGNOSIS — I10 Essential (primary) hypertension: Secondary | ICD-10-CM | POA: Diagnosis not present

## 2017-07-05 NOTE — Patient Instructions (Signed)
Medication Instructions:  NO CHANGES- Your physician recommends that you continue on your current medications as directed. Please refer to the Current Medication list given to you today.  If you need a refill on your cardiac medications before your next appointment, please call your pharmacy.  Follow-Up: Your physician wants you to follow-up in: 12 MONTHS WITH DR HILTY You should receive a reminder letter in the mail two months in advance. If you do not receive a letter, please call our office [phone] to schedule the 07-2018 follow-up appointment.   Thank you for choosing [facility]!!

## 2017-12-18 IMAGING — CR DG HIP (WITH OR WITHOUT PELVIS) 2-3V*R*
4 series · 4 of 4 positions shown · non-contrast
Comparison: Radiographs dated 01/26/2015

CLINICAL DATA: Right hip pain. Multiple recent falls, the most
recent 5 days ago.

EXAM:
DG HIP (WITH OR WITHOUT PELVIS) 2-3V RIGHT

[pelvis ap (1 of 2)]
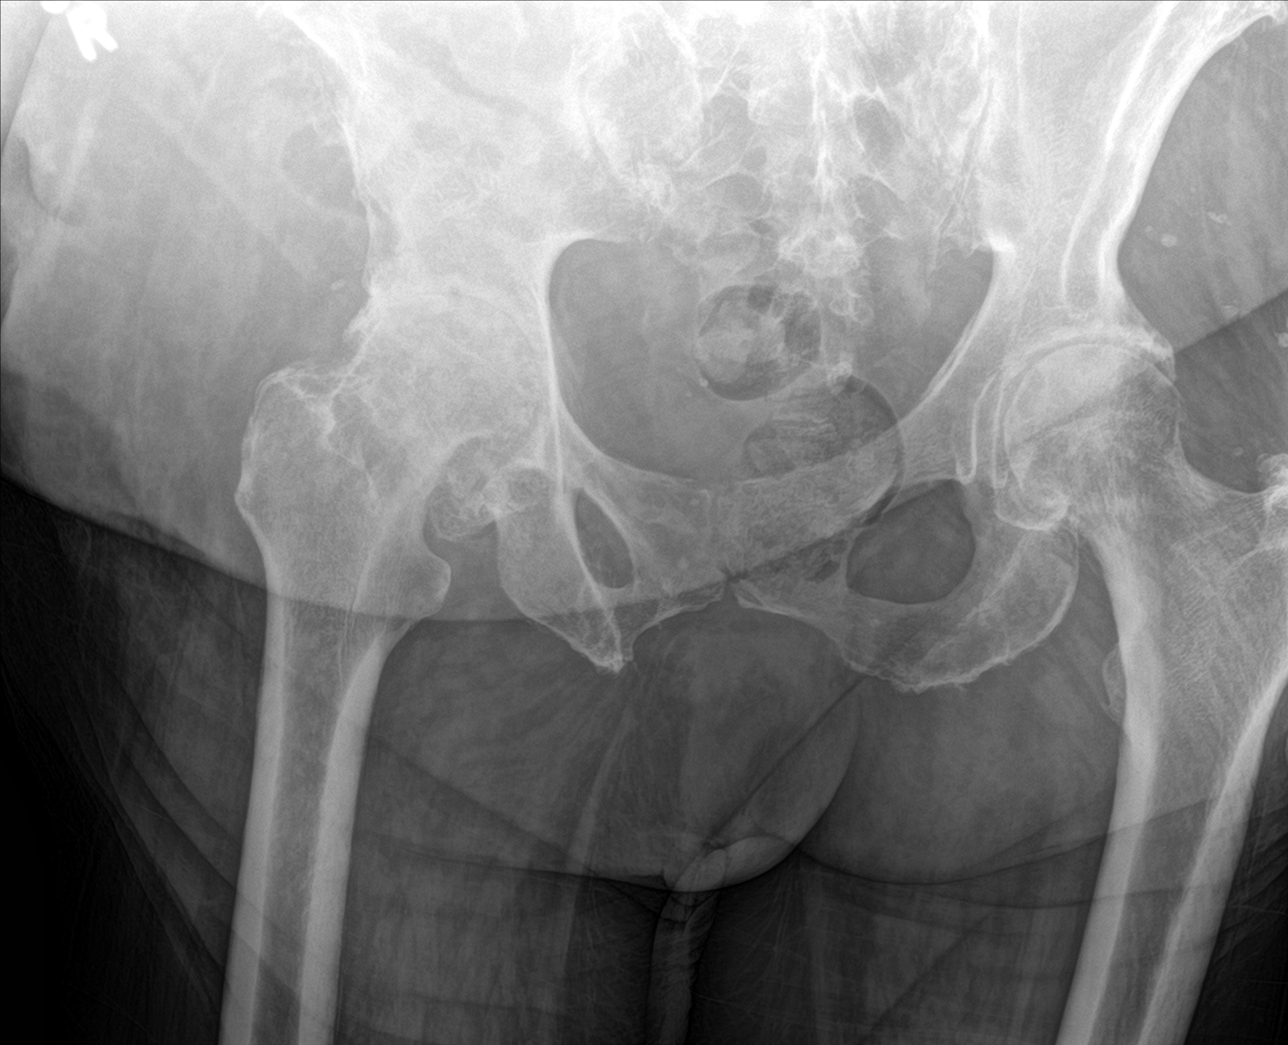

[hip ap]
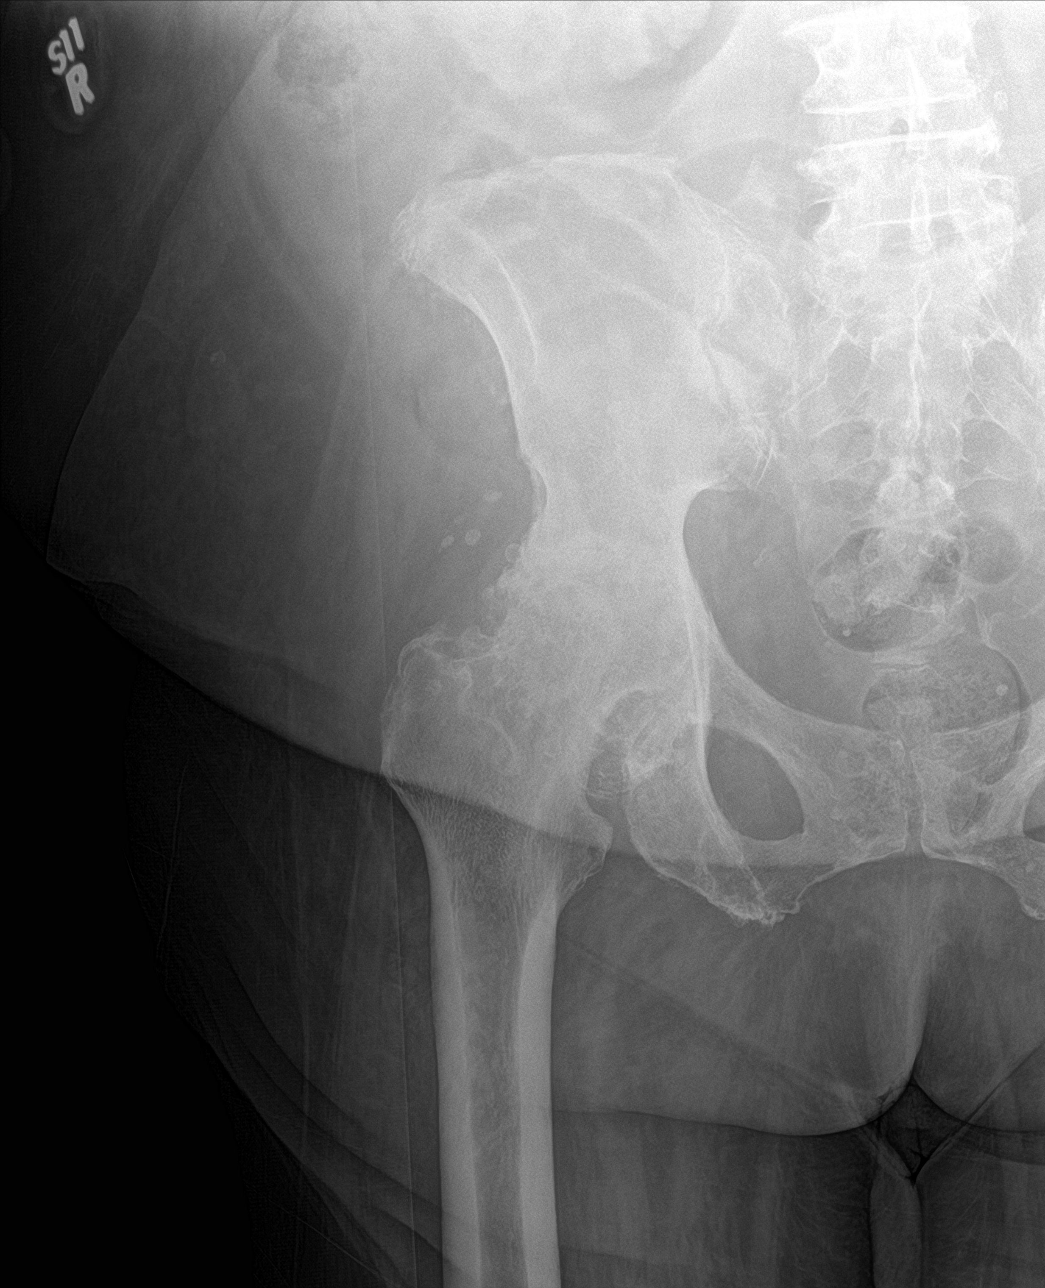

[hip lat]
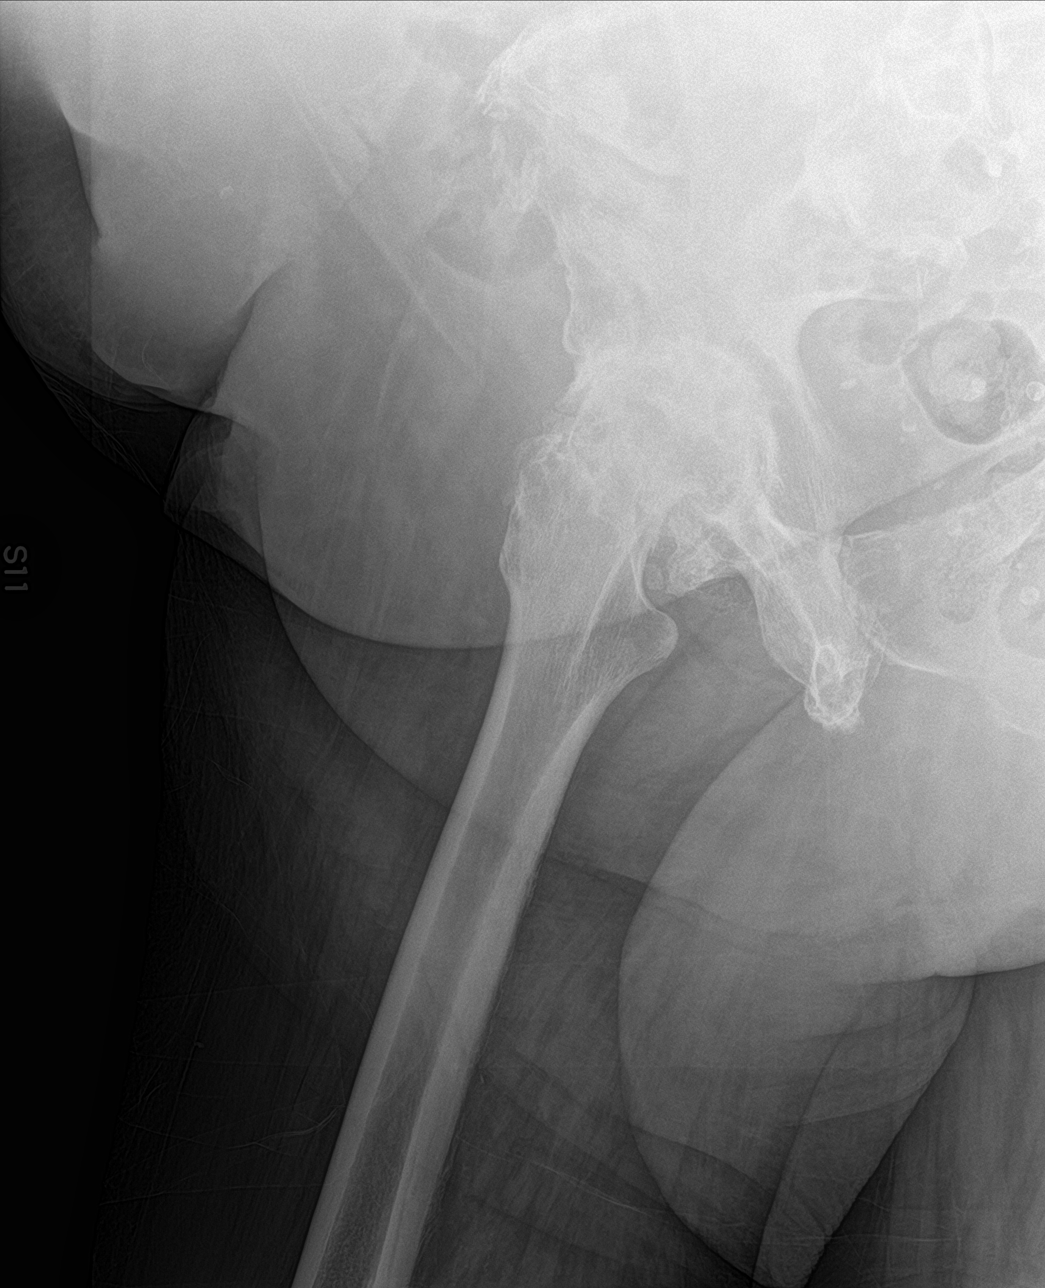

[pelvis ap (2 of 2)]
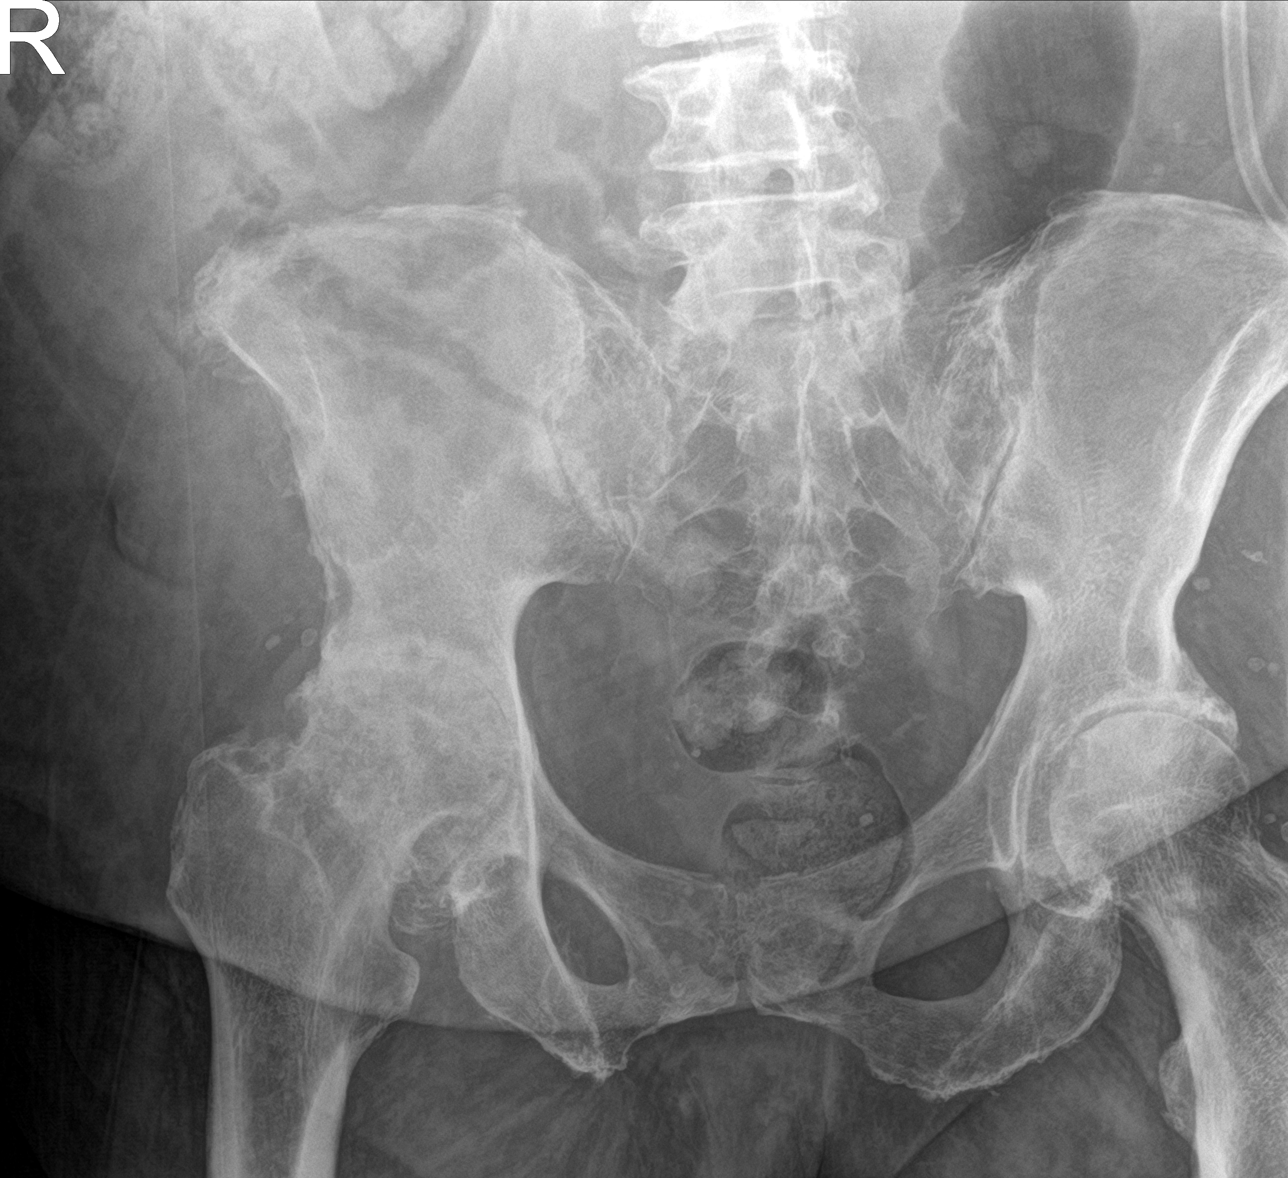

[4 of 4 positions shown; findings below may reference images not displayed]

FINDINGS: The patient has severe osteoarthritis of the right hip with erosion
of the superior aspect of the right acetabulum with prominent
marginal osteophytes on the acetabulum and femoral head with
erosions of the acetabulum and femoral head. There are less severe
osteoarthritic changes of the left hip.

There is no fracture or dislocation.
IMPRESSION: No acute bone abnormality. Progressive severe osteoarthritis of the
right hip.

## 2018-07-12 ENCOUNTER — Telehealth: Payer: Self-pay | Admitting: Internal Medicine

## 2018-07-12 NOTE — Telephone Encounter (Signed)
I called the patient to ask if she will still be seeing Dr. Allyne Gee so I can schedule an appt.  The home number listed goes to [facility] (spoke with Nadine Counts), so patient seems to be a resident there. VDM (DD)

## 2019-08-24 ENCOUNTER — Telehealth (INDEPENDENT_AMBULATORY_CARE_PROVIDER_SITE_OTHER): Payer: Medicare Other | Admitting: Physician Assistant

## 2019-08-24 ENCOUNTER — Encounter: Payer: Self-pay | Admitting: Physician Assistant

## 2019-08-24 ENCOUNTER — Telehealth: Payer: Self-pay

## 2019-08-24 VITALS — BP 160/76 | HR 67

## 2019-08-24 DIAGNOSIS — R0600 Dyspnea, unspecified: Secondary | ICD-10-CM

## 2019-08-24 DIAGNOSIS — E785 Hyperlipidemia, unspecified: Secondary | ICD-10-CM | POA: Diagnosis not present

## 2019-08-24 DIAGNOSIS — I10 Essential (primary) hypertension: Secondary | ICD-10-CM | POA: Diagnosis not present

## 2019-08-24 MED ORDER — HYDRALAZINE HCL 25 MG PO TABS: 25.0000 mg | ORAL_TABLET | Freq: Four times a day (QID) | ORAL | 3 refills | Status: AC

## 2019-08-24 NOTE — Progress Notes (Signed)
Virtual Visit via Telephone Note   This visit type was conducted due to national recommendations for restrictions regarding the COVID-19 Pandemic (e.g. social distancing) in an effort to limit this patient's exposure and mitigate transmission in our community.  Due to her co-morbid illnesses, this patient is at least at moderate risk for complications without adequate follow up.  This format is felt to be most appropriate for this patient at this time.  The patient did not have access to video technology/had technical difficulties with video requiring transitioning to audio format only (telephone).  All issues noted in this document were discussed and addressed.  No physical exam could be performed with this format.  Please refer to the patient's chart for her  consent to telehealth for [facility].   The patient was identified using 2 identifiers.  Date:  08/26/2019   ID:  Brianna David, DOB January 12, 1930, MRN [id number]  Patient Location: Home Provider Location: Home  PCP:  Brianna Chard, MD  Cardiologist:  Brianna Casino, MD  Electrophysiologist:  None   Evaluation Performed:  Follow-Up Visit  Chief Complaint:  followup  History of Present Illness:    Brianna David is a 86 y.o. female with past medical history of hypertension, hyperlipidemia, obesity and history of dyspnea.  Last Myoview in April 2010 was negative.  Echocardiogram obtained in 2019 showed EF 70%.  She is wheelchair-bound and currently lives in skilled nursing facility.  Her last follow-up was in April 2019 at which time she was seen by Bunnie Domino, NP.  She was doing well at the time and blood pressure was controlled.  She apparently has chronic lower extremity edema and calf and recommended knee-high support hose to assist with her dependent edema.  Patient was contacted today via telephone.  I spoke with her nurse at [facility], Brianna David.  Her lower extremity edema has resolved.  Her blood  pressure ranges somewhere between 120s to 170s.  Majority of the time, her systolic blood pressure is elevated.  I recommended increasing hydralazine from the current 10 mg 4 times daily to 25 mg 4 times daily.  She is aware to contact cardiology service if her systolic blood pressure is less than 100 mmHg.  Otherwise, patient remained wheelchair-bound and has significant knee issue.  Otherwise she denies any chest pain or shortness of breath.  The patient does not have symptoms concerning for COVID-19 infection (fever, chills, cough, or new shortness of breath).    Past Medical History:  Diagnosis Date  . Dyspnea   . Hyperlipidemia   . Hypertension   . Morbid obesity ([place])    Past Surgical History:  Procedure Laterality Date  . CARDIAC CATHETERIZATION  08/07/2008   Normal coronary arteries and LV function, mildly elevated R heart pressures-due to morbid obesity, continued on aggresive risk factor management  . CARDIOVASCULAR STRESS TEST  07/12/2008   Evidence of moderate ischemia seen in Basal Anterior, Mid Anterior, Apical Anterior, and Apical regions. EKG is negative for ischemia.  Brianna David PARTIAL HYSTERECTOMY  1972  . TRANSTHORACIC ECHOCARDIOGRAM  07/12/2008   EF 73%, mild AI, trace MR, PR, and TR     Current Meds  Medication Sig  . Ascorbic Acid (VITAMIN C) 500 MG CAPS Take 500 mg by mouth daily.  Brianna David aspirin 81 MG chewable tablet Chew 81 mg by mouth daily.  . Cholecalciferol 1000 UNITS capsule Take 2,000 Units by mouth daily.   . DULoxetine (CYMBALTA) 60 MG capsule Take 60 mg  by mouth daily.  . furosemide (LASIX) 40 MG tablet Take 40 mg by mouth daily.  . hydrALAZINE (APRESOLINE) 25 MG tablet Take 1 tablet (25 mg total) by mouth 4 (four) times daily.  Brianna David HYDROcodone-acetaminophen (NORCO/VICODIN) 5-325 MG tablet Take 1 tablet by mouth every 6 (six) hours as needed for moderate pain.  Brianna David losartan-hydrochlorothiazide (HYZAAR) 100-25 MG tablet Take 1 tablet by mouth daily.   . meloxicam (MOBIC)  7.5 MG tablet Take 7.5 mg by mouth daily.  . Menthol, Topical Analgesic, (BIOFREEZE) 4 % GEL Apply 1 application topically 3 (three) times daily as needed.  . Menthol, Topical Analgesic, (ICY HOT EX) Apply 1 application topically 3 (three) times daily.  . Multiple Vitamin (MULTIVITAMIN WITH MINERALS) TABS tablet Take 1 tablet by mouth daily.  . potassium chloride (MICRO-K) 10 MEQ CR capsule Take 1 capsule by mouth every morning.   . sennosides-docusate sodium (SENOKOT-S) 8.6-50 MG tablet Take 1 tablet by mouth 2 (two) times daily.  . Zinc 50 MG TABS Take 50 mg by mouth daily.  . [DISCONTINUED] hydrALAZINE (APRESOLINE) 10 MG tablet      Allergies:   Penicillins   Social History   Tobacco Use  . Smoking status: Never Smoker  . Smokeless tobacco: Never Used  Substance Use Topics  . Alcohol use: No  . Drug use: No     Family Hx: The patient's family history includes Cancer in her mother; Heart attack in her father; Heart failure in her maternal grandfather and maternal grandmother.  ROS:   Please see the history of present illness.     All other systems reviewed and are negative.   Prior CV studies:   The following studies were reviewed today:  Echo 07/12/2008    Labs/Other Tests and Data Reviewed:    EKG:  An ECG dated 07/05/2017 was personally reviewed today and demonstrated:  Normal sinus rhythm without significant ST-T wave changes.  Recent Labs: No results found for requested labs within last 8760 hours.   Recent Lipid Panel Lab Results  Component Value Date/Time   CHOL 149 07/09/2016 12:00 AM   TRIG 133 07/09/2016 12:00 AM   HDL 48 07/09/2016 12:00 AM   LDLCALC 74 07/09/2016 12:00 AM    Wt Readings from Last 3 Encounters:  08/26/16 260 lb (117.9 kg)  08/06/16 260 lb (117.9 kg)  07/08/16 260 lb 6.4 oz (118.1 kg)     Objective:    Vital Signs:  BP (!) 160/76   Pulse 67    VITAL SIGNS:  reviewed  ASSESSMENT & PLAN:    1. History of dyspnea: When the  patient was last seen by my colleague Bailey Mech, NP in April 2019, she has some lower extremity dependent edema due to wheelchair dependence.  According to her nurse, her lower extremity edema has resolved   2. Hypertension: Systolic blood pressure has been elevated at the nursing home.  I recommended increase hydralazine from the current 10 mg 4 times daily to 25 mg 4 times daily  3. Hyperlipidemia: Currently not on any statin medication, however given her wheelchair-bound status and chronic disability, I did not try to place her on new medication at this time.  COVID-19 Education: The signs and symptoms of COVID-19 were discussed with the patient and how to seek care for testing (follow up with PCP or arrange E-visit).  The importance of social distancing was discussed today.  Time:   Today, I have spent 10 minutes with the patient with telehealth  technology discussing the above problems.     Medication Adjustments/Labs and Tests Ordered: Current medicines are reviewed at length with the patient today.  Concerns regarding medicines are outlined above.   Tests Ordered: No orders of the defined types were placed in this encounter.   Medication Changes: Meds ordered this encounter  Medications  . hydrALAZINE (APRESOLINE) 25 MG tablet    Sig: Take 1 tablet (25 mg total) by mouth 4 (four) times daily.    Dispense:  360 tablet    Refill:  3    Follow Up:  Either In Person or Virtual in 1 year(s)  Signed, Azalee Course, PA  08/26/2019 11:04 PM    [facility]

## 2019-08-24 NOTE — Telephone Encounter (Signed)
  Patient Consent for Virtual Visit         Brianna David has provided verbal consent on 08/24/2019 for a virtual visit (video or telephone).   CONSENT FOR VIRTUAL VISIT FOR:  Brianna David  By participating in this virtual visit I agree to the following:  I hereby voluntarily request, consent and authorize [facility] and its employed or contracted physicians, physician assistants, nurse practitioners or other licensed health care professionals (the Practitioner), to provide me with telemedicine health care services (the "Services") as deemed necessary by the treating Practitioner. I acknowledge and consent to receive the Services by the Practitioner via telemedicine. I understand that the telemedicine visit will involve communicating with the Practitioner through live audiovisual communication technology and the disclosure of certain medical information by electronic transmission. I acknowledge that I have been given the opportunity to request an in-person assessment or other available alternative prior to the telemedicine visit and am voluntarily participating in the telemedicine visit.  I understand that I have the right to withhold or withdraw my consent to the use of telemedicine in the course of my care at any time, without affecting my right to future care or treatment, and that the Practitioner or I may terminate the telemedicine visit at any time. I understand that I have the right to inspect all information obtained and/or recorded in the course of the telemedicine visit and may receive copies of available information for a reasonable fee.  I understand that some of the potential risks of receiving the Services via telemedicine include:  Marland Kitchen Delay or interruption in medical evaluation due to technological equipment failure or disruption; . Information transmitted may not be sufficient (e.g. poor resolution of images) to allow for appropriate medical decision making by the  Practitioner; and/or  . In rare instances, security protocols could fail, causing a breach of personal health information.  Furthermore, I acknowledge that it is my responsibility to provide information about my medical history, conditions and care that is complete and accurate to the best of my ability. I acknowledge that Practitioner's advice, recommendations, and/or decision may be based on factors not within their control, such as incomplete or inaccurate data provided by me or distortions of diagnostic images or specimens that may result from electronic transmissions. I understand that the practice of medicine is not an exact science and that Practitioner makes no warranties or guarantees regarding treatment outcomes. I acknowledge that a copy of this consent can be made available to me via my patient portal [facility] MyChart), or I can request a printed copy by calling the office of [facility].    I understand that my insurance will be billed for this visit.   I have read or had this consent read to me. . I understand the contents of this consent, which adequately explains the benefits and risks of the Services being provided via telemedicine.  . I have been provided ample opportunity to ask questions regarding this consent and the Services and have had my questions answered to my satisfaction. . I give my informed consent for the services to be provided through the use of telemedicine in my medical care

## 2019-08-24 NOTE — Patient Instructions (Addendum)
Medication Instructions:   INCREASE Hydralazine to 25 mg 4 times a day  *If you need a refill on your cardiac medications before your next appointment, please call your pharmacy*  Lab Work: NONE ordered at this time of appointment   If you have labs (blood work) drawn today and your tests are completely normal, you will receive your results only by: Marland Kitchen MyChart Message (if you have MyChart) OR . A paper copy in the mail If you have any lab test that is abnormal or we need to change your treatment, we will call you to review the results.  Testing/Procedures: NONE ordered at this time of appointment   Follow-Up: At [facility], you and your health needs are our priority.  As part of our continuing mission to provide you with exceptional heart care, we have created designated Provider Care Teams.  These Care Teams include your primary Cardiologist (physician) and Advanced Practice Providers (APPs -  Physician Assistants and Nurse Practitioners) who all work together to provide you with the care you need, when you need it.  We recommend signing up for the patient portal called "MyChart".  Sign up information is provided on this After Visit Summary.  MyChart is used to connect with patients for Virtual Visits (Telemedicine).  Patients are able to view lab/test results, encounter notes, upcoming appointments, etc.  Non-urgent messages can be sent to your provider as well.   To learn more about what you can do with MyChart, go to [email or web address].    Your next appointment:   1 year(s)  The format for your next appointment:   In Person  Provider:   Kirtland Bouchard [place] Hilty, MD  Other Instructions   Continue to monitor blood pressure. If Systolic blood pressure (top number) is persistently less than 100 give our office a call at [phone] ask for triage.

## 2019-08-26 ENCOUNTER — Encounter: Payer: Self-pay | Admitting: Physician Assistant

## 2024-01-04 DEATH — deceased
